# Patient Record
Sex: Male | Born: 1990 | Race: Black or African American | Hispanic: No | Marital: Single | Smoking: Never smoker
Health system: Southern US, Community
[De-identification: ages and names within clinical notes are randomized; demographics above are authoritative.]

## PROBLEM LIST (undated history)

## (undated) DIAGNOSIS — G44009 Cluster headache syndrome, unspecified, not intractable: Secondary | ICD-10-CM

---
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
## 2010-04-11 ENCOUNTER — Emergency Department (HOSPITAL_COMMUNITY): Admission: EM | Admit: 2010-04-11 | Discharge: 2010-04-11 | Payer: Self-pay | Admitting: Emergency Medicine

## 2010-04-27 ENCOUNTER — Ambulatory Visit: Payer: Self-pay | Admitting: Family Medicine

## 2010-06-08 ENCOUNTER — Encounter: Payer: Self-pay | Admitting: *Deleted

## 2010-10-30 NOTE — Assessment & Plan Note (Signed)
Summary: f/u migrain,df   Vital Signs:  Patient profile:   20 year old male Height:      67.75 inches Weight:      146.1 pounds BMI:     22.46 Temp:     97.7 degrees F Pulse rate:   96 / minute BP sitting:   121 / 76  (left arm) Cuff size:   regular  Vitals Entered By: Garen Grams LPN (April 27, 2010 2:18 PM) CC: frequent migraines Is Patient Diabetic? No Pain Assessment Patient in pain? no        Primary Care Provider:  Romero Belling MD  CC:  frequent migraines.  History of Present Illness: Patient here for c/o HA daily upon awakening x 5 years.  Frontal, last 5-10 mins then usually resolve, associated with dizziness, occ nausea, occ photosensitivity, no sens to sound.  Worse with lying down.  He denies any visual disturbances, and states he had his eyes checked 2 months ago and did not near glasses/contacts.  He drinks caffeinated beverages 1-2 x/week, does not take any meds for HA (does not like to take meds at all), drinks ETOH on holidays only. He states he never feels rested despite 10 hrs of sleep a night. Denies snoring or nighttime awakening. He was seen in the ED recently for HA and referred to Neurologist in [place].    PMHx: Allergic rhinitis - but does not know what he's allergic to, states "nothing." On no meds. PSHx: Neg Meds: none    Habits & Providers  Alcohol-Tobacco-Diet     Tobacco Status: never  Allergies: No Known Drug Allergies  Physical Exam  General:  Well-developed,well-nourished,in no acute distress; alert,appropriate and cooperative throughout examination Head:  Normocephalic and atraumatic without obvious abnormalities. No apparent alopecia or balding. Tight braids (gets hair done q2 months - denies any association with HA) Eyes:  No corneal or conjunctival inflammation noted. EOMI. Perrla. Funduscopic exam benign, without hemorrhages, exudates or papilledema. Vision grossly normal.  Squinting frequently during exam. Ears:  External  ear exam shows no significant lesions or deformities.  Otoscopic examination reveals clear canals, tympanic membranes are intact bilaterally without bulging, retraction, inflammation or discharge. Hearing is grossly normal bilaterally. Nose:  External nasal examination shows no deformity or inflammation. Nasal mucosa are pink and moist without lesions or exudates. L turbinates mildly edematous. No pain on palpation of sinuses. Mouth:  Oral mucosa and oropharynx without lesions or exudates.  Unable to open mouth fully. No TMJ tenderness or clicking. Neck:  No deformities, masses, or tenderness noted. No occipital or paraspinal cervical tenderness. Lungs:  Normal respiratory effort, chest expands symmetrically. Lungs are clear to auscultation, no crackles or wheezes. Heart:  Normal rate and regular rhythm. S1 and S2 normal without gallop, murmur, click, rub or other extra sounds. Msk:  No deformity or scoliosis noted of thoracic or lumbar spine.   Extremities:  No clubbing, cyanosis, edema, or deformity noted with normal full range of motion of all joints.   Neurologic:  No cranial nerve deficits noted. Station and gait are normal. Plantar reflexes are down-going bilaterally. DTRs are symmetrical throughout. Sensory, motor and coordinative functions appear intact.   Impression & Recommendations:  Problem # 1:  HEADACHE (ICD-784.0) No clear etiology.  Will order sleep study as HA are upon awakening and patient never feels rested after sleep.  Discussed foods that are common HA triggers. Urged patient to keep appointment with Neuro in 2 weeks.   Orders: Split Night (Split Night)  [facility]- Est Level  3 ([id number])

## 2010-10-30 NOTE — Miscellaneous (Signed)
Summary: unable to go to sleep disorders center  Clinical Lists Changes she did not have a ride there. she will call them when able to reschedule...Marland KitchenMarland KitchenGolden Circle RN  June 08, 2010 12:08 PM
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
## 2010-12-16 LAB — URINALYSIS, ROUTINE W REFLEX MICROSCOPIC
Hgb urine dipstick: NEGATIVE
Nitrite: NEGATIVE
Protein, ur: NEGATIVE mg/dL
Specific Gravity, Urine: 1.024 (ref 1.005–1.030)
Urobilinogen, UA: 0.2 mg/dL (ref 0.0–1.0)

## 2010-12-16 LAB — GLUCOSE, CAPILLARY: Glucose-Capillary: 103 mg/dL — ABNORMAL HIGH (ref 70–99)

## 2012-02-18 ENCOUNTER — Encounter: Payer: Self-pay | Admitting: Family Medicine

## 2012-06-11 ENCOUNTER — Encounter: Payer: Self-pay | Admitting: Family Medicine

## 2012-06-18 ENCOUNTER — Encounter: Payer: Self-pay | Admitting: Family Medicine

## 2012-06-26 ENCOUNTER — Ambulatory Visit (HOSPITAL_COMMUNITY)
Admission: RE | Admit: 2012-06-26 | Discharge: 2012-06-26 | Disposition: A | Discharge status: Home / Self Care | Payer: Medicaid Other | Source: Ambulatory Visit | Attending: Family Medicine | Admitting: Family Medicine

## 2012-06-26 ENCOUNTER — Ambulatory Visit (INDEPENDENT_AMBULATORY_CARE_PROVIDER_SITE_OTHER): Payer: Medicaid Other | Admitting: Family Medicine

## 2012-06-26 ENCOUNTER — Encounter: Payer: Self-pay | Admitting: Family Medicine

## 2012-06-26 VITALS — BP 105/74 | HR 67 | Temp 98.3°F | Ht 67.75 in | Wt 148.0 lb

## 2012-06-26 DIAGNOSIS — R42 Dizziness and giddiness: Secondary | ICD-10-CM | POA: Insufficient documentation

## 2012-06-26 DIAGNOSIS — Z0000 Encounter for general adult medical examination without abnormal findings: Secondary | ICD-10-CM

## 2012-06-26 DIAGNOSIS — M549 Dorsalgia, unspecified: Secondary | ICD-10-CM
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
## 2012-06-26 NOTE — Patient Instructions (Signed)
It was good to see you.  I want to get an ECHO, an ultrasound of your heart to make sure there is nothing dangerous causing your dizziness when you are working out.  Please be sure to keep that appointment.  I do not want you to do any high intensity exercise until that study is done.    Please plan on your next check up in 1 year, or sooner if needed.  Happy Birthday next week.
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
## 2012-06-26 NOTE — Assessment & Plan Note (Signed)
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  Feel that at his age it is unlikely to be HOCM, but cannot rule out.  ECG was NSR, will have ECHO done to rule out HOCM. Advised not to do strenuous exercise until ECHO done and normal.

## 2012-07-06 ENCOUNTER — Encounter: Payer: Self-pay | Admitting: Family Medicine

## 2012-07-08 ENCOUNTER — Ambulatory Visit (HOSPITAL_COMMUNITY): Payer: Self-pay | Attending: Family Medicine

## 2015-08-10 ENCOUNTER — Encounter (HOSPITAL_COMMUNITY): Payer: Self-pay | Admitting: Emergency Medicine

## 2015-08-10 ENCOUNTER — Emergency Department (HOSPITAL_COMMUNITY)
Admission: EM | Admit: 2015-08-10 | Discharge: 2015-08-10 | Disposition: A | Discharge status: Home / Self Care | Payer: Medicaid Other | Attending: Emergency Medicine | Admitting: Emergency Medicine

## 2015-08-10 DIAGNOSIS — K920 Hematemesis: Secondary | ICD-10-CM

## 2015-08-10 DIAGNOSIS — K2970 Gastritis, unspecified, without bleeding: Secondary | ICD-10-CM | POA: Insufficient documentation

## 2015-08-10 LAB — CBC WITH DIFFERENTIAL/PLATELET
Basophils Absolute: 0 10*3/uL (ref 0.0–0.1)
Basophils Relative: 0 %
Eosinophils Absolute: 0.1 10*3/uL (ref 0.0–0.7)
Eosinophils Relative: 1 %
HCT: 47.7 % (ref 39.0–52.0)
Hemoglobin: 16.4 g/dL (ref 13.0–17.0)
Lymphocytes Relative: 9 %
Lymphs Abs: 0.6 10*3/uL — ABNORMAL LOW (ref 0.7–4.0)
MCH: 30.6 pg (ref 26.0–34.0)
MCHC: 34.4 g/dL (ref 30.0–36.0)
MCV: 89 fL (ref 78.0–100.0)
Monocytes Absolute: 0.3 10*3/uL (ref 0.1–1.0)
Monocytes Relative: 5 %
Neutro Abs: 5.3 10*3/uL (ref 1.7–7.7)
Neutrophils Relative %: 85 %
Platelets: 245 10*3/uL (ref 150–400)
RBC: 5.36 MIL/uL (ref 4.22–5.81)
RDW: 13.4 % (ref 11.5–15.5)
WBC: 6.4 10*3/uL (ref 4.0–10.5)

## 2015-08-10 LAB — COMPREHENSIVE METABOLIC PANEL
ALT: 11 U/L — ABNORMAL LOW (ref 17–63)
AST: 18 U/L (ref 15–41)
Albumin: 3.4 g/dL — ABNORMAL LOW (ref 3.5–5.0)
Alkaline Phosphatase: 51 U/L (ref 38–126)
Anion gap: 10 (ref 5–15)
BUN: 10 mg/dL (ref 6–20)
CO2: 27 mmol/L (ref 22–32)
Calcium: 9.1 mg/dL (ref 8.9–10.3)
Chloride: 100 mmol/L — ABNORMAL LOW (ref 101–111)
Creatinine, Ser: 1.29 mg/dL — ABNORMAL HIGH (ref 0.61–1.24)
GFR calc Af Amer: >60 mL/min (ref >60)
GFR calc non Af Amer: >60 mL/min (ref >60)
Glucose, Bld: 98 mg/dL (ref 65–99)
Potassium: 4.7 mmol/L (ref 3.5–5.1)
Sodium: 137 mmol/L (ref 135–145)
Total Bilirubin: 1.1 mg/dL (ref 0.3–1.2)
Total Protein: 6.3 g/dL — ABNORMAL LOW (ref 6.5–8.1)

## 2015-08-10 LAB — LIPASE, BLOOD: Lipase: 25 U/L (ref 11–51)

## 2015-08-10 MED ORDER — GI COCKTAIL ~~LOC~~
30.0000 mL | Freq: Once | ORAL | Status: AC
  Administered 2015-08-10: 30 mL via ORAL
  Filled 2015-08-10: qty 30

## 2015-08-10 MED ORDER — ONDANSETRON HCL 4 MG PO TABS: 4.0000 mg | ORAL_TABLET | Freq: Four times a day (QID) | ORAL | Status: DC

## 2015-08-10 MED ORDER — ONDANSETRON 4 MG PO TBDP
4.0000 mg | ORAL_TABLET | Freq: Once | ORAL | Status: AC
  Administered 2015-08-10: 4 mg via ORAL
  Filled 2015-08-10: qty 1

## 2015-08-10 NOTE — Discharge Instructions (Signed)
Gastritis, Adult Gastritis is soreness and puffiness (inflammation) of the lining of the stomach. If you do not get help, gastritis can cause bleeding and sores (ulcers) in the stomach. HOME CARE   Only take medicine as told by your doctor.  If you were given antibiotic medicines, take them as told. Finish the medicines even if you start to feel better.  Drink enough fluids to keep your pee (urine) clear or pale yellow.  Avoid foods and drinks that make your problems worse. Foods you may want to avoid include:  Caffeine or alcohol.  Chocolate.  Mint.  Garlic and onions.  Spicy foods.  Citrus fruits, including oranges, lemons, or limes.  Food containing tomatoes, including sauce, chili, salsa, and pizza.  Fried and fatty foods.  Eat small meals throughout the day instead of large meals. GET HELP RIGHT AWAY IF:   You have black or dark red poop (stools).  You throw up (vomit) blood. It may look like coffee grounds.  You cannot keep fluids down.  Your belly (abdominal) pain gets worse.  You have a fever.  You do not feel better after 1 week.  You have any other questions or concerns. MAKE SURE YOU:   Understand these instructions.  Will watch your condition.  Will get help right away if you are not doing well or get worse.   This information is not intended to replace advice given to you by your health care provider. Make sure you discuss any questions you have with your health care provider.   Document Released: 03/04/2008 Document Revised: 12/09/2011 Document Reviewed: 10/30/2011 Elsevier Interactive Patient Education 2016 [employer].  Please read attached information. If you experience any new or worsening signs or symptoms please return to the emergency room for evaluation. Please follow-up with your primary care provider or specialist as discussed. Please use medication prescribed only as directed and discontinue taking if you have any concerning signs or  symptoms.    Emergency Department Resource Guide 1) Find a Doctor and Pay Out of Pocket Although you won't have to find out who is covered by your insurance plan, it is a good idea to ask around and get recommendations. You will then need to call the office and see if the doctor you have chosen will accept you as a new patient and what types of options they offer for patients who are self-pay. Some doctors offer discounts or will set up payment plans for their patients who do not have insurance, but you will need to ask so you aren't surprised when you get to your appointment.  2) Contact Your Local Health Department Not all health departments have doctors that can see patients for sick visits, but many do, so it is worth a call to see if yours does. If you don't know where your local health department is, you can check in your phone book. The CDC also has a tool to help you locate your state's health department, and many state websites also have listings of all of their local health departments.  3) Find a Walk-in Clinic If your illness is not likely to be very severe or complicated, you may want to try a walk in clinic. These are popping up all over the country in pharmacies, drugstores, and shopping centers. They're usually staffed by nurse practitioners or physician assistants that have been trained to treat common illnesses and complaints. They're usually fairly quick and inexpensive. However, if you have serious medical issues or chronic medical problems,  these are probably not your best option.  No Primary Care Doctor: - Call Health Connect at  [phone] - they can help you locate a primary care doctor that  accepts your insurance, provides certain services, etc. - Physician Referral Service- [phone]  Chronic Pain Problems: Organization         Address  Phone   Notes  [facility]  [phone] Patients need to be referred by their primary care doctor.    Medication Assistance: Organization         Address  Phone   Notes  [facility] Medication [facility] [address] [place], [place] [address] [phone] --Must be a resident of [facility] -- Must have NO insurance coverage whatsoever (no Medicaid/ Medicare, etc.) -- The pt. MUST have a primary care doctor that directs their care regularly and follows them in the community   [facility]  [phone]   [employer]  [phone]    Agencies that provide inexpensive medical care: Organization         Address  Phone   Notes  [facility]  [phone]   [facility]    [phone]   [facility] [address] [place], [place] [address] [phone]   [facility] [address] [place] [phone]   Planned Parenthood    [phone]   [facility]    [phone]   [facility]  [address], [place] Phone:  [phone], Fax:  [phone] Hours of Operation:  9 am - 6 pm, M-F.  Also accepts Medicaid/Medicare and self-pay.  [facility]  [address], [place] Phone: [phone], Fax: [phone]. Hours of Operation:  8:30 am - 5:30 pm, M-F.  Also accepts Medicaid and self-pay.  [facility] [place] [address], [place] Phone: [phone]   [facility] [address] Natasha Bence [place], [place] [phone], Ext. [phone] Mondays & Thursdays: 7-9 AM.  First 15 patients are seen on a first come, first serve basis.    Medicaid-accepting [facility] Providers:  Organization         Address  Phone   Notes  [facility] [address], [place] [phone] Also accepts self-pay patients.  [facility] [address] Laurell Josephs [place], [place]  [phone]   [facility] [address], [place] [phone]   [facility] [address] [place] [phone]   Renaye Rakers [address], [place]   [phone] Only accepts [place] patients after they have their name applied to their card.   Self-Pay (no insurance) in [facility]:  Organization         Address  Phone   Notes  Sickle Cell Patients, [facility] [address] [place], [place] [phone]   [facility] [address] [place], [place] [phone]   [facility] [place]  [address], [place] [phone]   [facility]/Dr. Osei-Bonsu  [address], [place] or [address], [place] [phone] Phone number for both [place] and [place] locations is the same.  [facility]gent [facility] and  [facility] [address], [place] [phone]   [facility] [address] [place] or [address] [phone]   [facility] [address], [place] [phone], phone; [phone], fax Sees patients 1st and 3rd Saturday of every month.  Must not qualify for public or private insurance (i.e. Medicaid, Medicare, [place] Health Choice, Veterans' Benefits)  Household income should be no more than 200% of the poverty level The clinic cannot treat you if you are pregnant or think you are pregnant  Sexually transmitted diseases are not treated at the clinic.    Dental Care: Organization         Address  Phone  Notes  [facility] Department of [facility] [address] [place], [place] [phone] Accepts children up to age 32 who are enrolled in [place] or [place] Health Choice; pregnant women with a Medicaid card; and children who have applied for Medicaid or [place] Health Choice, but were declined, whose parents can pay a reduced fee at time of service.    [facility] Department of [facility]  [address], [place] [phone] Accepts children up to age 43 who are enrolled in [place] or [place] Health Choice; pregnant women with a Medicaid card; and children who have applied for Medicaid or  Health Choice, but were declined, whose parents can pay a reduced fee at time of service.  [facility]  [address] [place], [place] [phone] Patients are seen by appointment only. Walk-ins are not accepted. [facility] will see patients 48 years of age and older. Monday - Tuesday (8am-5pm) Most Wednesdays (8:30-5pm) $30 per visit, cash only  [facility] Dental Access [facility]  [address], [facility] [phone] Patients are seen by appointment only. Walk-ins are not accepted. [facility] will see patients 40 years of age and older. One Wednesday Evening (Monthly: Volunteer Based).  $30 per visit, cash only  [employer]  [phone] for adults; Children under age 84, call Graduate Pediatric Dentistry at [phone]. Children aged 74-14, please call [phone] to request a pediatric application.  Dental services are provided in all areas of dental care including fillings, crowns and bridges, complete and partial dentures, implants, gum treatment, root canals, and extractions. Preventive care is also provided. Treatment is provided to both adults and children. Patients are selected via a lottery and there is often a waiting list.   [facility] [address] [place]  [phone] [email or web address]   [facility] [address] [place], [place] [phone], Ext. [phone] Second and Fourth Thursday of each month, opens at 6:30 AM; Clinic ends at 9 AM.  Patients are seen on a first-come first-served basis, and a limited number are seen during each clinic.   [facility]  [address] Ether Griffins [place], [place] [phone]   Eligibility Requirements You must have lived in [place], [place], or [place] counties for at least the last three months.   You cannot be eligible for state or federal sponsored [place], including [employer], [place], or [employer].   You generally cannot be eligible for healthcare insurance through your employer.    How to apply: Eligibility screenings are held every Tuesday and Wednesday afternoon from 1:00 pm until 4:00 pm. You do not need an appointment for the interview!  [place]  [facility] [address], [place], [place] [phone]   [facility]  [phone]   [facility]  [phone]   [facility]  [phone]    Behavioral Health Resources in the Community: Intensive Outpatient Programs Organization         Address  Phone  Notes  [facility] [address], [place], [place] [phone]   [facility] [address], [place], [place] [phone]   [facility] [address], [place], [place]  [phone]   [facility] [address],  [place], [place] [phone] or [phone]   Substance Abuse Resources Organization         Address  Phone  Notes  [facility]  [phone]   [facility]  [phone]   [facility]  [phone]   [facility]  [phone]   Residential & Outpatient Substance Abuse Program  [phone]   Psychological Services Organization         Address  Phone  Notes  [facility]  [phone]- [phone]   [facility]  [phone]- [phone]   [facility] [address], [place] [phone] or [phone]    Mobile Crisis Teams Organization         Address  Phone  Notes  [facility], Mobile Crisis Care Unit  [phone]   [facility]  [address] [place], [place] [phone]   Doristine LocksSharon DeEsch [address] [place] [phone]    Self-Help/Support Groups Organization         Address  Phone             Notes  [facility] [place] - variety of support groups  336- [phone] Call for more information  Narcotics Anonymous (NA), [facility] [address], [place]  2 meetings at this location   Statisticianesidential Treatment Programs Organization         Address  Phone  Notes  [facility] [address],    [place]  [phone]   [facility]  [address], [place] [phone], [place], [place] [phone]   [facility] [address], [place] [phone] Admissions: 8am-3pm M-F  [facility] [address]    [place], [place] [phone]   [facility] [address], [place], [place] [phone]   [facility] [address]  [place], [place] [phone]   [facility] - Intensive Outpatient [address], [place], [place] [phone]   [facility] ([facility] [address]  [place], [place] [phone] or [phone]   [facility] ([facility]) [address] [place], [place] [phone] Accepts Medicaid  [facility] [address]  [place] [phone] Substance Abuse/Addiction Treatment   [facility] Resources Organization         Address  Phone  Notes  [facility]  [phone]   Angie FavaJulie Brannon, PhD [address], [address] [place], [place]   [phone]) [phone] or [phone](336) [phone]   [facility]   [address] [place], [place] [phone]   [facility] [address], [place], [place] [phone](336) [phone] Insurance/Medicaid/sponsorship through [employer]  [facility] [address]  [place], [place] [phone]  Therapy/tele-psych/case  °[facility] [address]  ° [place], [place] [phone]    °Dr. Arfeen  [phone]   °[facility] [place]  [employer] [place] [facility] 1) [address],  °2) [address], [place] °3)  [address], [place] [phone] °([phone] ° °([phone]   °[place] Child Abuse Hotline (336) [phone] or ([phone] (After Hours)    ° ° ° °

## 2015-08-10 NOTE — ED Notes (Signed)
Pt states he started having abd cramping and vomiting this morning around 3am. Pt states he vomited blood when he arrived in the waiting room.

## 2015-08-10 NOTE — ED Provider Notes (Signed)
CSN: [id number]     Arrival date & time 08/10/15  1191 History   First MD Initiated Contact with Patient 08/10/15 0857     Chief Complaint  Patient presents with  . Abdominal Pain  . Vomiting    HPI   24 year old male presents today with abdominal pain, nausea or vomiting. Patient reports symptoms started last night, and progressed through this morning. Patient reports 3-4 episodes of nausea and vomiting, reporting "dark" pieces in his vomit. Patient reports that does not necessarily look like blood, but does not rebreathing anything red or similar color. At the time of evaluation patient reports that he still feeling nauseous, has not had any recent vomiting. He denies fever, chills, lower extremity swelling or edema. Patient reports that up until last night he was eating and drinking appropriately, denies anyone in the house with similar symptoms. Patient denies any drug or alcohol use, exposure to abnormal food or drinks  History reviewed. No pertinent past medical history. History reviewed. No pertinent past surgical history. Family History  Problem Relation Age of Onset  . Heart disease Cousin 53    Died suddenly during exercise of heart problem- patient does not know what kind.   Social History  Substance Use Topics  . Smoking status: Never Smoker   . Smokeless tobacco: Never Used  . Alcohol Use: No    Review of Systems  All other systems reviewed and are negative.   Allergies  Review of patient's allergies indicates no known allergies.  Home Medications   Prior to Admission medications   Medication Sig Start Date End Date Taking? Authorizing Provider  ondansetron (ZOFRAN) 4 MG tablet Take 1 tablet (4 mg total) by mouth every 6 (six) hours. 08/10/15   Eyvonne Mechanic, PA-C   BP 111/68 mmHg  Pulse 77  Temp(Src) 97.4 F (36.3 C) (Oral)  Resp 12  Ht  (1.702 m)  Wt 138 lb (62.596 kg)  BMI 21.61 kg/m2  SpO2 99%   Physical Exam  Constitutional: He is oriented to  person, place, and time. He appears well-developed and well-nourished.  HENT:  Head: Normocephalic and atraumatic.  Eyes: Conjunctivae are normal. Pupils are equal, round, and reactive to light. Right eye exhibits no discharge. Left eye exhibits no discharge. No scleral icterus.  Neck: Normal range of motion. No JVD present. No tracheal deviation present.  Cardiovascular: Normal rate, regular rhythm, normal heart sounds and intact distal pulses.  Exam reveals no gallop and no friction rub.   No murmur heard. Pulmonary/Chest: Effort normal and breath sounds normal. No stridor. No respiratory distress. He has no wheezes. He has no rales. He exhibits no tenderness.  Minor epigastric discomfort to palpation  Abdominal: Soft. He exhibits no distension and no mass. There is tenderness. There is no rebound and no guarding.  Musculoskeletal: Normal range of motion. He exhibits no edema or tenderness.  Neurological: He is alert and oriented to person, place, and time. Coordination normal.  Skin: Skin is warm and dry. No rash noted. No erythema. No pallor.  Psychiatric: He has a normal mood and affect. His behavior is normal. Judgment and thought content normal.  Nursing note and vitals reviewed.   ED Course  Procedures (including critical care time) Labs Review Labs Reviewed  CBC WITH DIFFERENTIAL/PLATELET - Abnormal; Notable for the following:    Lymphs Abs 0.6 (*)    All other components within normal limits  COMPREHENSIVE METABOLIC PANEL - Abnormal; Notable for the following:    Chloride  100 (*)    Creatinine, Ser 1.29 (*)    Total Protein 6.3 (*)    Albumin 3.4 (*)    ALT 11 (*)    All other components within normal limits  LIPASE, BLOOD    Imaging Review No results found. I have personally reviewed and evaluated these images and lab results as part of my medical decision-making.   EKG Interpretation None      MDM   Final diagnoses:  Gastritis  Hematemesis with nausea     Labs: CBC, CMP, lipase- significant for creatinine 1.29  Imaging:  Consults:  Therapeutics: Zofran, GI cocktail  Discharge Meds: Zofran  Assessment/Plan: Patient presentation most consistent with gastritis. He is afebrile, vital signs reassuring, tolerating by mouth without difficulty. No episodes of nausea or vomiting while in the ED. patient's mother present at the time of evaluation, patient will be discharged home with her with instructions to maintain adequate by mouth intake, monitor for new or worsening signs or symptoms, return immediately if any present. Patient is instructed to follow-up with his primary care provider in 2-3 days for reevaluation of elevated creatinine, although I believe this probably 2 to acute decrease in hydration status. Patient has no other concerning signs or symptoms today. Patient given strict return presents.         Eyvonne MechanicJeffrey Starlin Steib, PA-C 08/10/15 1731  Marily MemosJason Mesner, MD 08/11/15 [phone]

## 2015-08-12 ENCOUNTER — Emergency Department (HOSPITAL_COMMUNITY)
Admission: EM | Admit: 2015-08-12 | Discharge: 2015-08-12 | Disposition: A | Discharge status: Home / Self Care | Payer: Medicaid Other | Attending: Emergency Medicine | Admitting: Emergency Medicine

## 2015-08-12 ENCOUNTER — Emergency Department (HOSPITAL_COMMUNITY): Payer: Medicaid Other

## 2015-08-12 ENCOUNTER — Encounter (HOSPITAL_COMMUNITY): Payer: Self-pay | Admitting: Emergency Medicine

## 2015-08-12 DIAGNOSIS — R05 Cough: Secondary | ICD-10-CM | POA: Insufficient documentation

## 2015-08-12 DIAGNOSIS — K529 Noninfective gastroenteritis and colitis, unspecified: Secondary | ICD-10-CM | POA: Insufficient documentation

## 2015-08-12 DIAGNOSIS — R1033 Periumbilical pain: Secondary | ICD-10-CM | POA: Insufficient documentation

## 2015-08-12 LAB — CBC WITH DIFFERENTIAL/PLATELET
Basophils Absolute: 0 10*3/uL (ref 0.0–0.1)
Basophils Relative: 0 %
Eosinophils Absolute: 0.1 10*3/uL (ref 0.0–0.7)
Eosinophils Relative: 4 %
HCT: 43.5 % (ref 39.0–52.0)
HEMOGLOBIN: 15.1 g/dL (ref 13.0–17.0)
LYMPHS ABS: 1.1 10*3/uL (ref 0.7–4.0)
LYMPHS PCT: 28 %
MCH: 30.8 pg (ref 26.0–34.0)
MCHC: 34.7 g/dL (ref 30.0–36.0)
MCV: 88.8 fL (ref 78.0–100.0)
Monocytes Absolute: 0.5 10*3/uL (ref 0.1–1.0)
Monocytes Relative: 12 %
NEUTROS ABS: 2.3 10*3/uL (ref 1.7–7.7)
NEUTROS PCT: 56 %
Platelets: 259 10*3/uL (ref 150–400)
RBC: 4.9 MIL/uL (ref 4.22–5.81)
RDW: 13.4 % (ref 11.5–15.5)
WBC: 4 10*3/uL (ref 4.0–10.5)

## 2015-08-12 LAB — COMPREHENSIVE METABOLIC PANEL
ALK PHOS: 50 U/L (ref 38–126)
ALT: 12 U/L — AB (ref 17–63)
ANION GAP: 8 (ref 5–15)
AST: 19 U/L (ref 15–41)
Albumin: 3.3 g/dL — ABNORMAL LOW (ref 3.5–5.0)
BUN: 7 mg/dL (ref 6–20)
CO2: 25 mmol/L (ref 22–32)
Calcium: 8.8 mg/dL — ABNORMAL LOW (ref 8.9–10.3)
Chloride: 105 mmol/L (ref 101–111)
Creatinine, Ser: 1.06 mg/dL (ref 0.61–1.24)
GFR calc Af Amer: >60 mL/min (ref >60)
GLUCOSE: 101 mg/dL — AB (ref 65–99)
POTASSIUM: 4.3 mmol/L (ref 3.5–5.1)
SODIUM: 138 mmol/L (ref 135–145)
TOTAL PROTEIN: 5.8 g/dL — AB (ref 6.5–8.1)
Total Bilirubin: 0.5 mg/dL (ref 0.3–1.2)

## 2015-08-12 LAB — LIPASE, BLOOD: Lipase: 28 U/L (ref 11–51)

## 2015-08-12 MED ORDER — IOHEXOL 300 MG/ML  SOLN
100.0000 mL | Freq: Once | INTRAMUSCULAR | Status: AC | PRN
  Administered 2015-08-12: 100 mL via INTRAVENOUS

## 2015-08-12 MED ORDER — PANTOPRAZOLE SODIUM 20 MG PO TBEC: 40.0000 mg | DELAYED_RELEASE_TABLET | Freq: Every day | ORAL | Status: DC

## 2015-08-12 MED ORDER — SODIUM CHLORIDE 0.9 % IV BOLUS (SEPSIS)
1000.0000 mL | Freq: Once | INTRAVENOUS | Status: AC
  Administered 2015-08-12: 1000 mL via INTRAVENOUS

## 2015-08-12 MED ORDER — PANTOPRAZOLE SODIUM 40 MG IV SOLR
40.0000 mg | INTRAVENOUS | Status: AC
  Administered 2015-08-12: 40 mg via INTRAVENOUS
  Filled 2015-08-12: qty 40

## 2015-08-12 MED ORDER — ONDANSETRON HCL 4 MG/2ML IJ SOLN
4.0000 mg | Freq: Once | INTRAMUSCULAR | Status: AC
  Administered 2015-08-12: 4 mg via INTRAVENOUS
  Filled 2015-08-12: qty 2

## 2015-08-12 NOTE — ED Provider Notes (Signed)
CSN: [id number]     Arrival date & time 08/12/15  4098 History   First MD Initiated Contact with Patient 08/12/15 [phone]     Chief Complaint  Patient presents with  . Emesis     (Consider location/radiation/quality/duration/timing/severity/associated sxs/prior Treatment) HPI Comments: Patient is a 24 year old male who presents to the ED with complaint of vomiting, onset 3 days. Patient states he woke up Thursday morning feeling nauseous and since has had multiple episodes of vomiting daily. He reports 2-3 episodes of emesis with red blood. He notes when he vomited this morning his emesis was all red. Endorses constant "gasy" abdominal pain with intermittent sharp pain that occurs with deep exhalation. Endorses chills, diarrhea (2 episodes yesterday) and productive cough (yellow sputum). He notes he has been taking Robitussin without relief. Denies fever, headache, rhinorrhea, nasal congestion, sore throat, SOB, wheezing, CP, urinary symptoms, blood in urine or stool. Patient was seen in the ED on 11/10 for same complaint, Cr 1.29 labs otherwise unremarkable. Patient symptoms were thought to be due to gastritis, improved with Zofran in the ED, pt was discharged home with Zofran. Patient reports he was unable to get Zofran filled due to cost. He notes he has only been able to keep down a little bit of water and food however he continues to have multiple episodes of vomiting throughout the day. Denies any prior abdominal surgeries.   History reviewed. No pertinent past medical history. History reviewed. No pertinent past surgical history. Family History  Problem Relation Age of Onset  . Heart disease Cousin 33    Died suddenly during exercise of heart problem- patient does not know what kind.   Social History  Substance Use Topics  . Smoking status: Never Smoker   . Smokeless tobacco: Never Used  . Alcohol Use: No    Review of Systems  Constitutional: Positive for chills.  Respiratory:  Positive for cough.   Gastrointestinal: Positive for nausea, vomiting, abdominal pain and diarrhea.       Belching  All other systems reviewed and are negative.     Allergies  Review of patient's allergies indicates no known allergies.  Home Medications   Prior to Admission medications   Medication Sig Start Date End Date Taking? Authorizing Provider  ibuprofen (ADVIL,MOTRIN) 200 MG tablet Take 600 mg by mouth every 6 (six) hours as needed for moderate pain.   Yes Historical Provider, MD  ondansetron (ZOFRAN) 4 MG tablet Take 1 tablet (4 mg total) by mouth every 6 (six) hours. Patient not taking: Reported on 08/12/2015 08/10/15   Eyvonne Mechanic, PA-C  pantoprazole (PROTONIX) 20 MG tablet Take 2 tablets (40 mg total) by mouth daily. 08/12/15   Satira Sark Angelmarie Ponzo, PA-C   BP 103/61 mmHg  Pulse 70  Temp(Src) 98.2 F (36.8 C) (Oral)  Resp 18  Ht  (1.702 m)  Wt 138 lb (62.596 kg)  BMI 21.61 kg/m2  SpO2 98% Physical Exam  Constitutional: He is oriented to person, place, and time. He appears well-developed and well-nourished. No distress.  HENT:  Head: Normocephalic and atraumatic.  Mouth/Throat: Uvula is midline, oropharynx is clear and moist and mucous membranes are normal. No oropharyngeal exudate, posterior oropharyngeal edema or posterior oropharyngeal erythema.  Eyes: Conjunctivae and EOM are normal. Pupils are equal, round, and reactive to light. Right eye exhibits no discharge. Left eye exhibits no discharge. No scleral icterus.  Neck: Normal range of motion. Neck supple.  Cardiovascular: Normal rate, regular rhythm, normal heart sounds and intact  distal pulses.   Pulmonary/Chest: Effort normal and breath sounds normal. No respiratory distress. He has no wheezes. He has no rales. He exhibits no tenderness.  Abdominal: Soft. Bowel sounds are normal. He exhibits no distension and no mass. There is tenderness in the periumbilical area. There is guarding (Pt guarding with  and witout palpation of abdomen.). There is no rigidity, no rebound, no CVA tenderness, no tenderness at McBurney's point and negative Murphy's sign.  Musculoskeletal: Normal range of motion. He exhibits no edema.  Lymphadenopathy:    He has no cervical adenopathy.  Neurological: He is alert and oriented to person, place, and time.  Skin: Skin is warm and dry. He is not diaphoretic.  Nursing note and vitals reviewed.   ED Course  Procedures (including critical care time) Labs Review Labs Reviewed  COMPREHENSIVE METABOLIC PANEL - Abnormal; Notable for the following:    Glucose, Bld 101 (*)    Calcium 8.8 (*)    Total Protein 5.8 (*)    Albumin 3.3 (*)    ALT 12 (*)    All other components within normal limits  CBC WITH DIFFERENTIAL/PLATELET  LIPASE, BLOOD    Imaging Review Ct Abdomen Pelvis W Contrast  08/12/2015  CLINICAL DATA:  Umbilical pain since Thursday with vomiting. EXAM: CT ABDOMEN AND PELVIS WITH CONTRAST TECHNIQUE: Multidetector CT imaging of the abdomen and pelvis was performed using the standard protocol following bolus administration of intravenous contrast. CONTRAST:  100mL OMNIPAQUE IOHEXOL 300 MG/ML  SOLN COMPARISON:  None. FINDINGS: Lung bases:  Clear.  Heart normal in size. Liver, spleen, gallbladder, pancreas, adrenal glands:  Normal. Kidneys, ureters, bladder:  Normal. Lymph nodes:  No adenopathy. Ascites:  None. Gastrointestinal: Stomach is moderately distended, primarily with fluid. Small bowel there is prominent fluid-filled, which shows no wall thickening or mesenteric inflammation. There is no evidence of obstruction. Colon is unremarkable. Normal appendix visualized. Musculoskeletal:  Normal. IMPRESSION: 1. Distended stomach and prominent fluid-filled small bowel. Findings are nonspecific but consistent with gastroenteritis in the proper clinical setting. No evidence of bowel obstruction. 2. No other abnormalities.  Normal appendix visualized. Electronically  Signed   By: Amie Portlandavid  Ormond M.D.   On: 08/12/2015 12:12   I have personally reviewed and evaluated these images and lab results as part of my medical decision-making.  Filed Vitals:   08/12/15 1205  BP: 103/61  Pulse: 70  Temp:   Resp: 18     MDM   Final diagnoses:  Gastroenteritis    Patient presents with nausea, vomiting and an episode of diarrhea. Reports having blood in emesis. Denies fever. Patient was seen in the ED on 11/10, diagnosed with gastritis and discharged home with Zofran. Patient was unable to fill Zofran due to cost. VSS. Exam revealed periumbilical pain, guarding present with and without palpation of abdomen. Patient given IV fluids, Zofran and Protonix. Labs unremarkable. CT abdomen revealed distended stomach consistent with gastroenteritis. Patient reports his nausea and abdominal pain have improved. Patient has not had an episode of emesis on the ED, tolerated by mouth. Plan to discharge patient home with PPI.  Evaluation does not show pathology requring ongoing emergent intervention or admission. Pt is hemodynamically stable and mentating appropriately. Discussed findings/results and plan with patient/guardian, who agrees with plan. All questions answered. Return precautions discussed and outpatient follow up given.      Satira Sarkicole Elizabeth AllenhurstNadeau, New JerseyPA-C 08/12/15 1241  Tilden FossaElizabeth Rees, MD 08/13/15 [phone]

## 2015-08-12 NOTE — ED Notes (Signed)
Patient transported to CT 

## 2015-08-12 NOTE — ED Notes (Signed)
Pt placed on monitor upon return to room from radiology. Pt remains monitored by blood pressure and pulse ox.

## 2015-08-12 NOTE — ED Notes (Signed)
Reports continuing to vomit since seen in ED; did not get zofran filled. States he can keep down water and food during the day. States he keeps waking up in the early morning hours with vomiting. Today it was "grainy (like grits) and red" which worried him, so he returned.

## 2015-08-12 NOTE — ED Notes (Signed)
Pts vital signs updated. Pt getting dressed and awaiting discharge paperwork at bedside.

## 2015-08-12 NOTE — Discharge Instructions (Signed)
Take your medications as prescribed. Please follow up with a primary care provider from the Resource Guide provided below in 1 week. Return to the emergency department if symptoms worsen or new onset of fever.

## 2015-08-17 ENCOUNTER — Emergency Department (HOSPITAL_COMMUNITY)
Admission: EM | Admit: 2015-08-17 | Discharge: 2015-08-17 | Disposition: A | Discharge status: Home / Self Care | Payer: Medicaid Other | Attending: Emergency Medicine | Admitting: Emergency Medicine

## 2015-08-17 ENCOUNTER — Encounter (HOSPITAL_COMMUNITY): Payer: Self-pay | Admitting: Cardiology

## 2015-08-17 DIAGNOSIS — R509 Fever, unspecified: Secondary | ICD-10-CM | POA: Insufficient documentation

## 2015-08-17 DIAGNOSIS — R110 Nausea: Secondary | ICD-10-CM | POA: Insufficient documentation

## 2015-08-17 DIAGNOSIS — J029 Acute pharyngitis, unspecified: Secondary | ICD-10-CM | POA: Insufficient documentation

## 2015-08-17 DIAGNOSIS — Z79899 Other long term (current) drug therapy: Secondary | ICD-10-CM | POA: Insufficient documentation

## 2015-08-17 LAB — COMPREHENSIVE METABOLIC PANEL
ALT: 7 U/L — ABNORMAL LOW (ref 17–63)
AST: 17 U/L (ref 15–41)
Albumin: 3.2 g/dL — ABNORMAL LOW (ref 3.5–5.0)
Alkaline Phosphatase: 49 U/L (ref 38–126)
Anion gap: 6 (ref 5–15)
BUN: 6 mg/dL (ref 6–20)
CO2: 27 mmol/L (ref 22–32)
Calcium: 8.8 mg/dL — ABNORMAL LOW (ref 8.9–10.3)
Chloride: 107 mmol/L (ref 101–111)
Creatinine, Ser: 1.15 mg/dL (ref 0.61–1.24)
GFR calc Af Amer: >60 mL/min (ref >60)
GFR calc non Af Amer: >60 mL/min (ref >60)
Glucose, Bld: 85 mg/dL (ref 65–99)
Potassium: 4.4 mmol/L (ref 3.5–5.1)
Sodium: 140 mmol/L (ref 135–145)
Total Bilirubin: 0.5 mg/dL (ref 0.3–1.2)
Total Protein: 5.9 g/dL — ABNORMAL LOW (ref 6.5–8.1)

## 2015-08-17 LAB — URINALYSIS, ROUTINE W REFLEX MICROSCOPIC
Bilirubin Urine: NEGATIVE
Glucose, UA: NEGATIVE mg/dL
Hgb urine dipstick: NEGATIVE
Ketones, ur: NEGATIVE mg/dL
Leukocytes, UA: NEGATIVE
Nitrite: NEGATIVE
Protein, ur: NEGATIVE mg/dL
Specific Gravity, Urine: 1.024 (ref 1.005–1.030)
pH: 7.5 (ref 5.0–8.0)

## 2015-08-17 LAB — CBC
HCT: 42.5 % (ref 39.0–52.0)
Hemoglobin: 14.5 g/dL (ref 13.0–17.0)
MCH: 30.2 pg (ref 26.0–34.0)
MCHC: 34.1 g/dL (ref 30.0–36.0)
MCV: 88.5 fL (ref 78.0–100.0)
Platelets: 261 10*3/uL (ref 150–400)
RBC: 4.8 MIL/uL (ref 4.22–5.81)
RDW: 13.1 % (ref 11.5–15.5)
WBC: 9.2 10*3/uL (ref 4.0–10.5)

## 2015-08-17 LAB — RAPID STREP SCREEN (MED CTR MEBANE ONLY): Streptococcus, Group A Screen (Direct): NEGATIVE

## 2015-08-17 NOTE — ED Notes (Signed)
States he was here last week with abd pain and vomiting. Reports he was told to follow up, is feeling little better but still having episodes of vomiting. Also reports a sore throat.

## 2015-08-17 NOTE — Discharge Instructions (Signed)
Sore Throat A sore throat is pain, burning, irritation, or scratchiness of the throat. There is often pain or tenderness when swallowing or talking. A sore throat may be accompanied by other symptoms, such as coughing, sneezing, fever, and swollen neck glands. A sore throat is often the first sign of another sickness, such as a cold, flu, strep throat, or mononucleosis (commonly known as mono). Most sore throats go away without medical treatment. CAUSES  The most common causes of a sore throat include:  A viral infection, such as a cold, flu, or mono.  A bacterial infection, such as strep throat, tonsillitis, or whooping cough.  Seasonal allergies.  Dryness in the air.  Irritants, such as smoke or pollution.  Gastroesophageal reflux disease (GERD). HOME CARE INSTRUCTIONS   Only take over-the-counter medicines as directed by your caregiver.  Drink enough fluids to keep your urine clear or pale yellow.  Rest as needed.  Try using throat sprays, lozenges, or sucking on hard candy to ease any pain (if older than 4 years or as directed).  Sip warm liquids, such as broth, herbal tea, or warm water with honey to relieve pain temporarily. You may also eat or drink cold or frozen liquids such as frozen ice pops.  Gargle with salt water (mix 1 tsp salt with 8 oz of water).  Do not smoke and avoid secondhand smoke.  Put a cool-mist humidifier in your bedroom at night to moisten the air. You can also turn on a hot shower and sit in the bathroom with the door closed for 5-10 minutes. SEEK IMMEDIATE MEDICAL CARE IF:  You have difficulty breathing.  You are unable to swallow fluids, soft foods, or your saliva.  You have increased swelling in the throat.  Your sore throat does not get better in 7 days.  You have nausea and vomiting.  You have a fever or persistent symptoms for more than 2-3 days.  You have a fever and your symptoms suddenly get worse. MAKE SURE YOU:   Understand  these instructions.  Will watch your condition.  Will get help right away if you are not doing well or get worse.   This information is not intended to replace advice given to you by your health care provider. Make sure you discuss any questions you have with your health care provider.   Document Released: 10/24/2004 Document Revised: 10/07/2014 Document Reviewed: 05/24/2012 Elsevier Interactive Patient Education 2016 [employer]   Emergency Department Resource Guide 1) Find a Doctor and Pay Out of Pocket Although you won't have to find out who is covered by your insurance plan, it is a good idea to ask around and get recommendations. You will then need to call the office and see if the doctor you have chosen will accept you as a new patient and what types of options they offer for patients who are self-pay. Some doctors offer discounts or will set up payment plans for their patients who do not have insurance, but you will need to ask so you aren't surprised when you get to your appointment.  2) Contact Your Local Health Department Not all health departments have doctors that can see patients for sick visits, but many do, so it is worth a call to see if yours does. If you don't know where your local health department is, you can check in your phone book. The CDC also has a tool to help you locate your state's health department, and many state websites also have listings  of all of their local health departments.  3) Find a [facility] If your illness is not likely to be very severe or complicated, you may want to try a walk in clinic. These are popping up all over the country in pharmacies, drugstores, and shopping centers. They're usually staffed by nurse practitioners or physician assistants that have been trained to treat common illnesses and complaints. They're usually fairly quick and inexpensive. However, if you have serious medical issues or chronic medical problems, these are  probably not your best option.  No Primary Care Doctor: - Call Health Connect at  [phone] - they can help you locate a primary care doctor that  accepts your insurance, provides certain services, etc. - Physician Referral Service- [phone]  Chronic Pain Problems: Organization         Address  Phone   Notes  [facility]  [phone] Patients need to be referred by their primary care doctor.   Medication Assistance: Organization         Address  Phone   Notes  [facility] Medication [facility] [address] [place], [place] [address] [phone] --Must be a resident of [facility] -- Must have NO insurance coverage whatsoever (no Medicaid/ Medicare, etc.) -- The pt. MUST have a primary care doctor that directs their care regularly and follows them in the community   Med[facility]ist  [phone]   [employer]  [phone]    Agencies that provide inexpensive medical care: Organization         Address  Phone   Notes  [place]  [phone]   [facility]    [phone]   [facility] [place], [place] [address] [phone]   [place] [address], [place] [phone]   Planned Parenthood    [phone]   [facility]    [phone]   [place] and [place] [address], [place] Phone:  [phone], Fax:  [phone] Hours of Operation:  9 am - 6 pm, M-F.  Also accepts Medicaid/Medicare and self-pay.  [facility] for [place] [address], [address], [place] Phone: [phone], Fax: [phone]. Hours of Operation:  8:30 am - 5:30 pm, M-F.  Also accepts Medicaid and self-pay.  [facility] [place] [address], [place] Phone: [phone]   [place], [place], [place] [phone], Ext. [phone] Mondays & Thursdays:  7-9 AM.  First 15 patients are seen on a first come, first serve basis.    [place]eller Providers:  Organization         Address  Phone   Notes  [facility] [address],  [phone] Also accepts self-pay patients.  [facility] [address], [place]  [phone]   [place], [address], [place] [phone]   [facility] [address] [place] [phone]   Lucianne Lei [address], [place]   [phone] Only accepts [place] Access [place] patients after they have their name applied to their card.   Self-Pay (no insurance) in [facility]:  Patent attorney   Notes  Sickle Cell Patients, [facility] Internal Medicine [place] [phone]   [facility] [place] [phone]   [facility] [place]  [place], [address], [place] [phone]   [facility]/Dr. Osei-Bonsu  [address], [place] or [address], [place] [phone] Phone number for both [place] and [place] locations is the same.  [facility] [address] [place] [phone]   [facility] [address] [place] or [address] [phone]   [facility] [address] [place] [phone], phone; [phone], fax Sees patients 1st and 3rd Saturday of every month.  Must not qualify for public or private insurance (i.e. Medicaid, Medicare, [place] Health Choice, Veterans' Benefits)  Household income should be no more than 200% of the poverty level The clinic cannot treat you if you are pregnant or think you are pregnant  Sexually transmitted diseases are not treated at the clinic.     Dental Care: Organization         Address  Phone  Notes  [facility] Department of [facility] [place] [phone] Accepts children up to age 34 who are enrolled in [place] or [place]; pregnant women with a Medicaid card; and children who have applied for Medicaid or [place] Health Choice, but were declined, whose parents can pay a reduced fee at time of service.  [facility] Department of [facility]  [address], [place] [phone] Accepts children up to age 56 who are enrolled in [place] or [place]; pregnant women with a Medicaid card; and children who have applied for Medicaid or [place] Health Choice, but were declined, whose parents can pay a reduced fee at time of service.  [place] [facility] Access PRO[facility]  [place] [phone] Patients are seen by appointment only. Walk-ins are not accepted. Campus will see patients 48 years of age and older. Monday - Tuesday (8am-5pm) Most Wednesdays (8:30-5pm) $30 per visit, cash only  [facility] Adult [facility] Access PRO[facility]  [address], [facility] [phone] Patients are seen by appointment only. Walk-ins are not accepted. [place] will see patients 80 years of age and older. One Wednesday Evening (Monthly: Volunteer Based).  $30 per visit, cash only  [place]  [phone] for adults; Children under age 67, call Graduate Pediatric Dentistry at [phone]. Children aged 22-14, please call [phone] to request a pediatric application.  Dental services are provided in all areas of dental care including fillings, crowns and bridges, complete and partial dentures, implants, gum treatment, root canals, and extractions. Preventive care is also provided. Treatment is provided to both adults and children. Patients are selected via a lottery and there is often a waiting  list.   [facility] [address], [place]  [phone] [email or web address]   [facility] [address] [place], [place] [phone], Ext. [phone] Second and Fourth Thursday of each month, opens at 6:30 AM; Clinic ends at 9 AM.  Patients are seen on a first-come first-served basis, and a limited number are seen during each clinic.   [facility]  [address] Hillard Danker [place], [place] [phone]   Eligibility  Requirements You must have lived in [place], [place], or [place] counties for at least the last three months.   You cannot be eligible for state or federal sponsored [employer], including [employer], [place], or [employer].   You generally cannot be eligible for healthcare insurance through your employer.    How to apply: Eligibility screenings are held every Tuesday and Wednesday afternoon from 1:00 pm until 4:00 pm. You do not need an appointment for the interview!  [facility] [address] [place], [place]   [place]  [place] Department  [place]  [phone]    Behavioral Health Resources in the Community: Intensive Outpatient Programs Organization         Address  Phone  Notes  [place]. [address] [place], [place] [phone]   [facility] Outpatient [address] [place], [place]   ADS: Alcohol & Drug Svcs [address], [place], [place]   [place] [address]  [place], [place] or [phone]   Substance Abuse Resources Organization         Address  Phone  Notes  Alcohol and Drug Services  [phone]   [place]  [phone]   [place]   Chinita Pester  [phone]   Residential & Outpatient Substance Abuse Program   [phone]   Psychological Services Organization         Address  Phone  Notes  [facility] [place]  [place]  [phone]   [place] [address], [place] or [phone]    Mobile Crisis Teams Organization         Address  Phone  Notes  [facility], Mobile Crisis Care Unit  [phone]   [facility]ive Psychotherapeutic Services  [address] [place], [place]   [place] [address] [place] [phone]    Self-Help/Support Groups Organization         Address  Phone             Notes  [place]. of [place] - variety of support groups  [place] Call for more information  Narcotics Anonymous (NA), [facility] [address], [employer] [place]  2 meetings at this location   Special educational needs teacher         Address  Phone  Notes  [facility]idential [facility] [place],    [place]  [phone]   [facility]  [address], [place] [address], [place], [place]   [place], [place] [phone] Admissions: 8am-3pm M-F  Incentives Substance [place] [address]    [place], [place] [phone]   [facility] [address] Jadene Pierini [place], [place]   [facility] [address],  [place], [place]   [facility] - Intensive Outpatient [address], [place], [place]   [facility] [place].,  [place], [place] or [phone]   [facility] (RTS) [address], [place], [place] Accepts Medicaid  [facility] [place] [address],  [place] [phone] Substance Abuse/Addiction Treatment   [facility] Resources Organization  Address  Phone  Notes  °[facility]  [phone]   °Julie Brannon, PhD [address] [place], [place]   ([phone] or ([phone]   °[facility]   [address] °[place], [place] [phone]   °[facility] [address], [place], [place] [phone] Insurance/Medicaid/sponsorship through [facility]  °[facility] [address]                                    [place], [place] [phone] Therapy/tele-psych/case  °[facility] [address]  ° [place], [place] [phone]    °Dr. Arfeen  [phone]   °[facility] [place]  [employer] [place] [facility] 1) [address], [place] °2) [address], [place] °3)  [address], [place] [phone] °([phone] ° °([phone]) [phone]   °[place] Child Abuse Hotline (336) [phone] or (336) [phone] (After Hours)    ° ° ° °

## 2015-08-18 ENCOUNTER — Emergency Department (HOSPITAL_COMMUNITY)
Admission: EM | Admit: 2015-08-18 | Discharge: 2015-08-18 | Disposition: A | Discharge status: Home / Self Care | Payer: Medicaid Other | Attending: Emergency Medicine | Admitting: Emergency Medicine

## 2015-08-18 ENCOUNTER — Encounter (HOSPITAL_COMMUNITY): Payer: Self-pay | Admitting: *Deleted

## 2015-08-18 DIAGNOSIS — Z79899 Other long term (current) drug therapy: Secondary | ICD-10-CM | POA: Insufficient documentation

## 2015-08-18 DIAGNOSIS — J029 Acute pharyngitis, unspecified: Secondary | ICD-10-CM | POA: Insufficient documentation

## 2015-08-18 LAB — RAPID STREP SCREEN (MED CTR MEBANE ONLY): Streptococcus, Group A Screen (Direct): NEGATIVE

## 2015-08-18 NOTE — ED Notes (Signed)
Pt with sore throat since Thurs.

## 2015-08-18 NOTE — Discharge Instructions (Signed)

## 2015-08-18 NOTE — ED Notes (Signed)
Pt c/o sore throat. Rapid strep done at triage. ALSO, c/o right wrist pain x 1 month. No known injury. States has been "playing basketball a lot".

## 2015-08-18 NOTE — ED Provider Notes (Signed)
CSN: [id number]     Arrival date & time 08/18/15  1401 History  By signing my name below, I, Soijett Blue, attest that this documentation has been prepared under the direction and in the presence of Roxy Horsemanobert Masiyah Engen, PA-C Electronically Signed: Soijett Blue, ED Scribe. 08/18/2015. 3:18 PM.   Chief Complaint  Patient presents with  . Sore Throat      The history is provided by the patient. No language interpreter was used.    Logan Simmons is a 24 y.o. male who presents to the Emergency Department complaining of sore throat onset 2-3 days. He states that he is having associated symptoms of painful swallowing. He states that he has tried robitussin with no relief for his symptoms. He denies fever, color change, rash, wound, and any other symptoms.   History reviewed. No pertinent past medical history. History reviewed. No pertinent past surgical history. Family History  Problem Relation Age of Onset  . Heart disease Cousin 9617    Died suddenly during exercise of heart problem- patient does not know what kind.   Social History  Substance Use Topics  . Smoking status: Never Smoker   . Smokeless tobacco: Never Used  . Alcohol Use: Yes     Comment: occ    Review of Systems  Constitutional: Negative for fever.  HENT: Positive for sore throat. Negative for trouble swallowing.   Skin: Negative for color change, rash and wound.    Allergies  Review of patient's allergies indicates no known allergies.  Home Medications   Prior to Admission medications   Medication Sig Start Date End Date Taking? Authorizing Provider  ondansetron (ZOFRAN) 4 MG tablet Take 1 tablet (4 mg total) by mouth every 6 (six) hours. Patient not taking: Reported on 08/12/2015 08/10/15   Eyvonne MechanicJeffrey Hedges, PA-C  pantoprazole (PROTONIX) 20 MG tablet Take 2 tablets (40 mg total) by mouth daily. 08/12/15   Satira SarkNicole Elizabeth Nadeau, PA-C   BP 111/65 mmHg  Pulse 81  Temp(Src) 99.2 F (37.3 C) (Oral)  Resp 16  Ht  5\' 7"  (1.702 m)  Wt 137 lb 12.8 oz (62.506 kg)  BMI 21.58 kg/m2  SpO2 98% Physical Exam  Constitutional: He is oriented to person, place, and time. He appears well-developed and well-nourished. No distress.  HENT:  Head: Normocephalic and atraumatic.  Oropharynx is mildly erythematous, no exudates, no abscess, uvula is midline, airway intact, no stridor  Eyes: EOM are normal.  Neck: Neck supple.  Cardiovascular: Normal rate, regular rhythm and normal heart sounds.  Exam reveals no gallop and no friction rub.   No murmur heard. Pulmonary/Chest: Effort normal and breath sounds normal. No respiratory distress. He has no wheezes. He has no rales.  CTAB  Musculoskeletal: Normal range of motion.  Neurological: He is alert and oriented to person, place, and time.  Skin: Skin is warm and dry.  Psychiatric: He has a normal mood and affect. His behavior is normal.  Nursing note and vitals reviewed.   ED Course  Procedures (including critical care time) DIAGNOSTIC STUDIES: Oxygen Saturation is 98% on RA, nl by my interpretation.    COORDINATION OF CARE: 3:18 PM Discussed treatment plan with pt at bedside which includes take either tylenol or motrin PRN and throat lozenges and pt agreed to plan.   Labs Review Labs Reviewed  RAPID STREP SCREEN (NOT [facility])  CULTURE, GROUP A STREP      MDM   Final diagnoses:  Sore throat    Pt afebrile  without tonsillar exudate, negative strep. Presents with mild cervical lymphadenopathy, & dysphagia; diagnosis of viral pharyngitis. No abx indicated. DC w symptomatic tx for pain  Pt does not appear dehydrated, but did discuss importance of water rehydration. Presentation non concerning for PTA or infxn spread to soft tissue. No trismus or uvula deviation. Specific return precautions discussed. Pt able to drink water in ED without difficulty with intact air way. Recommended PCP follow up.   I personally performed the services described in this  documentation, which was scribed in my presence. The recorded information has been reviewed and is accurate.      Roxy Horseman, PA-C 08/18/15 1529  Blane Ohara, MD 08/20/15 2204

## 2015-08-19 LAB — CULTURE, GROUP A STREP: Strep A Culture: NEGATIVE

## 2015-08-21 LAB — CULTURE, GROUP A STREP: STREP A CULTURE: NEGATIVE

## 2015-08-23 NOTE — ED Provider Notes (Signed)
CSN: [id number]     Arrival date & time 08/17/15  1105 History   First MD Initiated Contact with Patient 08/17/15 1306     Chief Complaint  Patient presents with  . Vomiting  . Sore Throat     (Consider location/radiation/quality/duration/timing/severity/associated sxs/prior Treatment) HPI 24 year old male with sore throat. Onset Thursday. Persistent since then. Worse with swallowing. Occasional nonproductive cough. No acute respiratory complaints. Subjective fever. Mild nausea. Patient was seen in the emergency room about a week ago with abdominal pain and vomiting. The symptoms have been improving. No sick contacts. History reviewed. No pertinent past medical history. History reviewed. No pertinent past surgical history. Family History  Problem Relation Age of Onset  . Heart disease Cousin 4917    Died suddenly during exercise of heart problem- patient does not know what kind.   Social History  Substance Use Topics  . Smoking status: Never Smoker   . Smokeless tobacco: Never Used  . Alcohol Use: Yes     Comment: occ    Review of Systems  All systems reviewed and negative, other than as noted in HPI.   Allergies  Review of patient's allergies indicates no known allergies.  Home Medications   Prior to Admission medications   Medication Sig Start Date End Date Taking? Authorizing Provider  pantoprazole (PROTONIX) 20 MG tablet Take 2 tablets (40 mg total) by mouth daily. 08/12/15  Yes Satira SarkNicole Elizabeth Nadeau, PA-C  ondansetron (ZOFRAN) 4 MG tablet Take 1 tablet (4 mg total) by mouth every 6 (six) hours. Patient not taking: Reported on 08/12/2015 08/10/15   Eyvonne MechanicJeffrey Hedges, PA-C   BP 116/71 mmHg  Pulse 69  Temp(Src) 97.4 F (36.3 C) (Oral)  Resp 14  Ht 5\' 7"  (1.702 m)  Wt 138 lb (62.596 kg)  BMI 21.61 kg/m2  SpO2 100% Physical Exam  Constitutional: He appears well-developed and well-nourished. No distress.  HENT:  Head: Normocephalic and atraumatic.  Mouth/Throat:  No oropharyngeal exudate.  Nonexudative pharyngitis. Uvula midline. Handling secretions. Normal sounding voice. Neck supple. No stridor. Submental tissues are soft.  Eyes: Conjunctivae and EOM are normal. Pupils are equal, round, and reactive to light. Right eye exhibits no discharge. Left eye exhibits no discharge.  Neck: Neck supple.  Cardiovascular: Normal rate, regular rhythm and normal heart sounds.  Exam reveals no gallop and no friction rub.   No murmur heard. Pulmonary/Chest: Effort normal and breath sounds normal. No respiratory distress.  Abdominal: Soft. He exhibits no distension. There is no tenderness.  Musculoskeletal: He exhibits no edema or tenderness.  Neurological: He is alert.  Skin: Skin is warm and dry.  Psychiatric: He has a normal mood and affect. His behavior is normal. Thought content normal.  Nursing note and vitals reviewed.   ED Course  Procedures (including critical care time) Labs Review Labs Reviewed  COMPREHENSIVE METABOLIC PANEL - Abnormal; Notable for the following:    Calcium 8.8 (*)    Total Protein 5.9 (*)    Albumin 3.2 (*)    ALT 7 (*)    All other components within normal limits  URINALYSIS, ROUTINE W REFLEX MICROSCOPIC (NOT AT [facility]) - Abnormal; Notable for the following:    APPearance CLOUDY (*)    All other components within normal limits  RAPID STREP SCREEN (NOT AT [facility])  CULTURE, GROUP A STREP  CBC    Imaging Review No results found. I have personally reviewed and evaluated these images and lab results as part of my medical decision-making.  EKG Interpretation None      MDM   Final diagnoses:  Pharyngitis    25 year old male with likely viral pharyngitis. No evidence of serious to space head/neck infection. Generally well-appearing. No respiratory distress. Plan symptomatic treatment.    Raeford Razor, MD 08/23/15 [phone]

## 2015-08-25 ENCOUNTER — Emergency Department (HOSPITAL_COMMUNITY)
Admission: EM | Admit: 2015-08-25 | Discharge: 2015-08-25 | Disposition: A | Discharge status: Home / Self Care | Payer: Medicaid Other | Attending: Emergency Medicine | Admitting: Emergency Medicine

## 2015-08-25 ENCOUNTER — Encounter (HOSPITAL_COMMUNITY): Payer: Self-pay | Admitting: Emergency Medicine

## 2015-08-25 DIAGNOSIS — R42 Dizziness and giddiness: Secondary | ICD-10-CM | POA: Insufficient documentation

## 2015-08-25 DIAGNOSIS — J019 Acute sinusitis, unspecified: Secondary | ICD-10-CM

## 2015-08-25 DIAGNOSIS — H748X3 Other specified disorders of middle ear and mastoid, bilateral: Secondary | ICD-10-CM | POA: Insufficient documentation

## 2015-08-25 MED ORDER — FLUTICASONE PROPIONATE 50 MCG/ACT NA SUSP
1.0000 | Freq: Two times a day (BID) | NASAL | Status: DC
  Administered 2015-08-25: 1 via NASAL
  Filled 2015-08-25 (×2): qty 16

## 2015-08-25 MED ORDER — DEXAMETHASONE 4 MG PO TABS
10.0000 mg | ORAL_TABLET | Freq: Once | ORAL | Status: AC
Start: 2015-08-25 — End: 2015-08-25
  Administered 2015-08-25: 10 mg via ORAL
  Filled 2015-08-25: qty 3

## 2015-08-25 NOTE — ED Notes (Signed)
Patient here with multiple complaints - states that over the past week, he has had a multitude of cold symptoms, headache, and dizziness when moving too fast.  Patient A&O x 4, ambulatory, appears to be in NAD at this time.

## 2015-08-25 NOTE — Discharge Instructions (Signed)
You were seen today for your on going cough, dizziness, and headache.  These symptoms seem related to your recent viral infections and appear to be secondary to build up of mucus and congestion in your sinuses.  Use the nose spray provided and also use an over the counter saline nasal spray.  You were also given a dose of steroids to help with swelling of your sinuses.  Follow up with a primary care physician outpatient for continued treatment and evaluation.  Sinusitis, Adult Sinusitis is redness, soreness, and inflammation of the paranasal sinuses. Paranasal sinuses are air pockets within the bones of your face. They are located beneath your eyes, in the middle of your forehead, and above your eyes. In healthy paranasal sinuses, mucus is able to drain out, and air is able to circulate through them by way of your nose. However, when your paranasal sinuses are inflamed, mucus and air can become trapped. This can allow bacteria and other germs to grow and cause infection. Sinusitis can develop quickly and last only a short time (acute) or continue over a long period (chronic). Sinusitis that lasts for more than 12 weeks is considered chronic. CAUSES Causes of sinusitis include:  Allergies.  Structural abnormalities, such as displacement of the cartilage that separates your nostrils (deviated septum), which can decrease the air flow through your nose and sinuses and affect sinus drainage.  Functional abnormalities, such as when the small hairs (cilia) that line your sinuses and help remove mucus do not work properly or are not present. SIGNS AND SYMPTOMS Symptoms of acute and chronic sinusitis are the same. The primary symptoms are pain and pressure around the affected sinuses. Other symptoms include:  Upper toothache.  Earache.  Headache.  Bad breath.  Decreased sense of smell and taste.  A cough, which worsens when you are lying flat.  Fatigue.  Fever.  Thick drainage from your nose,  which often is green and may contain pus (purulent).  Swelling and warmth over the affected sinuses. DIAGNOSIS Your health care provider will perform a physical exam. During your exam, your health care provider may perform any of the following to help determine if you have acute sinusitis or chronic sinusitis:  Look in your nose for signs of abnormal growths in your nostrils (nasal polyps).  Tap over the affected sinus to check for signs of infection.  View the inside of your sinuses using an imaging device that has a light attached (endoscope). If your health care provider suspects that you have chronic sinusitis, one or more of the following tests may be recommended:  Allergy tests.  Nasal culture. A sample of mucus is taken from your nose, sent to a lab, and screened for bacteria.  Nasal cytology. A sample of mucus is taken from your nose and examined by your health care provider to determine if your sinusitis is related to an allergy. TREATMENT Most cases of acute sinusitis are related to a viral infection and will resolve on their own within 10 days. Sometimes, medicines are prescribed to help relieve symptoms of both acute and chronic sinusitis. These may include pain medicines, decongestants, nasal steroid sprays, or saline sprays. However, for sinusitis related to a bacterial infection, your health care provider will prescribe antibiotic medicines. These are medicines that will help kill the bacteria causing the infection. Rarely, sinusitis is caused by a fungal infection. In these cases, your health care provider will prescribe antifungal medicine. For some cases of chronic sinusitis, surgery is needed. Generally, these  are cases in which sinusitis recurs more than 3 times per year, despite other treatments. HOME CARE INSTRUCTIONS  Drink plenty of water. Water helps thin the mucus so your sinuses can drain more easily.  Use a humidifier.  Inhale steam 3-4 times a day (for  example, sit in the bathroom with the shower running).  Apply a warm, moist washcloth to your face 3-4 times a day, or as directed by your health care provider.  Use saline nasal sprays to help moisten and clean your sinuses.  Take medicines only as directed by your health care provider.  If you were prescribed either an antibiotic or antifungal medicine, finish it all even if you start to feel better. SEEK IMMEDIATE MEDICAL CARE IF:  You have increasing pain or severe headaches.  You have nausea, vomiting, or drowsiness.  You have swelling around your face.  You have vision problems.  You have a stiff neck.  You have difficulty breathing.   This information is not intended to replace advice given to you by your health care provider. Make sure you discuss any questions you have with your health care provider.   Document Released: 09/16/2005 Document Revised: 10/07/2014 Document Reviewed: 10/01/2011 Elsevier Interactive Patient Education [employer]

## 2015-08-25 NOTE — ED Provider Notes (Signed)
CSN: [id number]     Arrival date & time 08/25/15  [id number] History   First MD Initiated Contact with Patient 08/25/15 [phone]     Chief Complaint  Patient presents with  . Headache  . Cough     (Consider location/radiation/quality/duration/timing/severity/associated sxs/prior Treatment) HPI Comments: 24 y.o. Male with history of allergic rhinitis, multiple ER visits recently presents for headache and cough.  The patient has been here multiple times over the last few weeks with different viral like symptoms.  He says he is back today because he still is not feeling 100% back to himself.  He says that he was going to see a primary care physician but there was going to be a copay and so his mother told him just to go to the ER.  He says that he continues to have intermittent headache in his forehead and soreness in his cheeks.  He also feels congested and has had a non productive cough that hs improved.  The patient states that he felt very hot and had a hard time sleeping.  He says his mother told him to come back in to the ER today for evaluation.  Patient is a 24 y.o. male presenting with headaches and cough.  Headache Associated symptoms: congestion, cough, dizziness and sinus pressure   Associated symptoms: no abdominal pain, no back pain, no diarrhea, no drainage, no fatigue, no fever, no myalgias, no nausea, no numbness, no seizures, no vomiting and no weakness   Cough Associated symptoms: headaches and rhinorrhea   Associated symptoms: no chest pain, no chills, no fever, no myalgias, no rash and no wheezing     History reviewed. No pertinent past medical history. History reviewed. No pertinent past surgical history. Family History  Problem Relation Age of Onset  . Heart disease Cousin 3317    Died suddenly during exercise of heart problem- patient does not know what kind.   Social History  Substance Use Topics  . Smoking status: Never Smoker   . Smokeless tobacco: Never Used  . Alcohol  Use: Yes     Comment: occ    Review of Systems  Constitutional: Negative for fever, chills and fatigue.  HENT: Positive for congestion, rhinorrhea and sinus pressure. Negative for ear discharge, postnasal drip and sneezing.   Respiratory: Positive for cough. Negative for chest tightness and wheezing.   Cardiovascular: Negative for chest pain and palpitations.  Gastrointestinal: Negative for nausea, vomiting, abdominal pain and diarrhea.  Genitourinary: Negative for dysuria, urgency and hematuria.  Musculoskeletal: Negative for myalgias and back pain.  Skin: Negative for rash.  Neurological: Positive for dizziness and headaches. Negative for seizures, weakness and numbness.  Hematological: Negative for adenopathy. Does not bruise/bleed easily.      Allergies  Review of patient's allergies indicates no known allergies.  Home Medications   Prior to Admission medications   Medication Sig Start Date End Date Taking? Authorizing Provider  guaiFENesin (ROBITUSSIN) 100 MG/5ML SOLN Take 5 mLs by mouth every 4 (four) hours as needed for cough or to loosen phlegm.   Yes Historical Provider, MD  ondansetron (ZOFRAN) 4 MG tablet Take 1 tablet (4 mg total) by mouth every 6 (six) hours. Patient not taking: Reported on 08/12/2015 08/10/15   Eyvonne MechanicJeffrey Hedges, PA-C  pantoprazole (PROTONIX) 20 MG tablet Take 2 tablets (40 mg total) by mouth daily. Patient not taking: Reported on 08/25/2015 08/12/15   Satira SarkNicole Elizabeth Nadeau, PA-C   BP 120/96 mmHg  Pulse 68  Temp(Src) 98 F (36.7  C) (Oral)  Resp 16  Ht  (1.702 m)  Wt 137 lb (62.143 kg)  BMI 21.45 kg/m2  SpO2 97% Physical Exam  Constitutional: He is oriented to person, place, and time. He appears well-developed and well-nourished. No distress.  HENT:  Head: Normocephalic and atraumatic.  Right Ear: External ear normal. Tympanic membrane is not bulging. A middle ear effusion is present.  Left Ear: External ear normal. Tympanic membrane is  not bulging. A middle ear effusion is present.  Nose: Mucosal edema present. Right sinus exhibits maxillary sinus tenderness and frontal sinus tenderness. Left sinus exhibits maxillary sinus tenderness and frontal sinus tenderness.  Mouth/Throat: Oropharynx is clear and moist and mucous membranes are normal. No oropharyngeal exudate, posterior oropharyngeal edema, posterior oropharyngeal erythema or tonsillar abscesses.  Poor transillumination of the sinuses.  Post nasal drip over the posterior pharynx.    Eyes: EOM are normal. Pupils are equal, round, and reactive to light.  Neck: Normal range of motion. Neck supple.  Cardiovascular: Normal rate, regular rhythm, normal heart sounds and intact distal pulses.   No murmur heard. Pulmonary/Chest: Effort normal. No respiratory distress. He has no wheezes. He has no rales.  Abdominal: Soft. He exhibits no distension. There is no tenderness.  Musculoskeletal: He exhibits no edema.  Neurological: He is alert and oriented to person, place, and time.  Skin: Skin is warm and dry. No rash noted. He is not diaphoretic.  Vitals reviewed.   ED Course  Procedures (including critical care time) Labs Review Labs Reviewed - No data to display  Imaging Review No results found. I have personally reviewed and evaluated these images and lab results as part of my medical decision-making.   EKG Interpretation None      MDM  Patient seen and evaluated in stable condition.  Patient well appearing.  Benign examination.  Afebrile with normal vital signs.  Physical examination consistent with viral sinusitis/allergic rhinitis.  Normal respiratory examination.  Patient given Decadron and Flonase and instructed to follow up with PCP which was stressed to the patient.  He expressed understanding and agreement with plan of care and was discharged home.  Final diagnoses:  Acute sinusitis, recurrence not specified, unspecified location    1. Sinusitis vs allergic  rhinitis    Leta Baptist, MD 08/25/15 1016

## 2016-02-16 ENCOUNTER — Ambulatory Visit (HOSPITAL_COMMUNITY)
Admission: EM | Admit: 2016-02-16 | Discharge: 2016-02-16 | Disposition: A | Discharge status: Home / Self Care | Payer: Medicaid Other | Attending: Emergency Medicine | Admitting: Emergency Medicine

## 2016-02-16 ENCOUNTER — Encounter (HOSPITAL_COMMUNITY): Payer: Self-pay | Admitting: Emergency Medicine

## 2016-02-16 DIAGNOSIS — J40 Bronchitis, not specified as acute or chronic: Secondary | ICD-10-CM

## 2016-02-16 MED ORDER — AZITHROMYCIN 250 MG PO TABS: ORAL_TABLET | ORAL | Status: DC

## 2016-02-16 MED ORDER — BENZONATATE 100 MG PO CAPS: 100.0000 mg | ORAL_CAPSULE | Freq: Three times a day (TID) | ORAL | Status: DC

## 2016-02-16 MED ORDER — ALBUTEROL SULFATE HFA 108 (90 BASE) MCG/ACT IN AERS: 2.0000 | INHALATION_SPRAY | RESPIRATORY_TRACT | Status: DC | PRN

## 2016-02-16 NOTE — Discharge Instructions (Signed)
Upper Respiratory Infection, Adult °Most upper respiratory infections (URIs) are a viral infection of the air passages leading to the lungs. A URI affects the nose, throat, and upper air passages. The most common type of URI is nasopharyngitis and is typically referred to as "the common cold." °URIs run their course and usually go away on their own. Most of the time, a URI does not require medical attention, but sometimes a bacterial infection in the upper airways can follow a viral infection. This is called a secondary infection. Sinus and middle ear infections are common types of secondary upper respiratory infections. °Bacterial pneumonia can also complicate a URI. A URI can worsen asthma and chronic obstructive pulmonary disease (COPD). Sometimes, these complications can require emergency medical care and may be life threatening.  °CAUSES °Almost all URIs are caused by viruses. A virus is a type of germ and can spread from one person to another.  °RISKS FACTORS °You may be at risk for a URI if:  °· You smoke.   °· You have chronic heart or lung disease. °· You have a weakened defense (immune) system.   °· You are very young or very old.   °· You have nasal allergies or asthma. °· You work in crowded or poorly ventilated areas. °· You work in health care facilities or schools. °SIGNS AND SYMPTOMS  °Symptoms typically develop 2-3 days after you come in contact with a cold virus. Most viral URIs last 7-10 days. However, viral URIs from the influenza virus (flu virus) can last 14-18 days and are typically more severe. Symptoms may include:  °· Runny or stuffy (congested) nose.   °· Sneezing.   °· Cough.   °· Sore throat.   °· Headache.   °· Fatigue.   °· Fever.   °· Loss of appetite.   °· Pain in your forehead, behind your eyes, and over your cheekbones (sinus pain). °· Muscle aches.   °DIAGNOSIS  °Your health care provider may diagnose a URI by: °· Physical exam. °· Tests to check that your symptoms are not due to  another condition such as: °· Strep throat. °· Sinusitis. °· Pneumonia. °· Asthma. °TREATMENT  °A URI goes away on its own with time. It cannot be cured with medicines, but medicines may be prescribed or recommended to relieve symptoms. Medicines may help: °· Reduce your fever. °· Reduce your cough. °· Relieve nasal congestion. °HOME CARE INSTRUCTIONS  °· Take medicines only as directed by your health care provider.   °· Gargle warm saltwater or take cough drops to comfort your throat as directed by your health care provider. °· Use a warm mist humidifier or inhale steam from a shower to increase air moisture. This may make it easier to breathe. °· Drink enough fluid to keep your urine clear or pale yellow.   °· Eat soups and other clear broths and maintain good nutrition.   °· Rest as needed.   °· Return to work when your temperature has returned to normal or as your health care provider advises. You may need to stay home longer to avoid infecting others. You can also use a face mask and careful hand washing to prevent spread of the virus. °· Increase the usage of your inhaler if you have asthma.   °· Do not use any tobacco products, including cigarettes, chewing tobacco, or electronic cigarettes. If you need help quitting, ask your health care provider. °PREVENTION  °The best way to protect yourself from getting a cold is to practice good hygiene.  °· Avoid oral or hand contact with people with cold   symptoms.   °· Wash your hands often if contact occurs.   °There is no clear evidence that vitamin C, vitamin E, echinacea, or exercise reduces the chance of developing a cold. However, it is always recommended to get plenty of rest, exercise, and practice good nutrition.  °SEEK MEDICAL CARE IF:  °· You are getting worse rather than better.   °· Your symptoms are not controlled by medicine.   °· You have chills. °· You have worsening shortness of breath. °· You have brown or red mucus. °· You have yellow or brown nasal  discharge. °· You have pain in your face, especially when you bend forward. °· You have a fever. °· You have swollen neck glands. °· You have pain while swallowing. °· You have white areas in the back of your throat. °SEEK IMMEDIATE MEDICAL CARE IF:  °· You have severe or persistent: °¨ Headache. °¨ Ear pain. °¨ Sinus pain. °¨ Chest pain. °· You have chronic lung disease and any of the following: °¨ Wheezing. °¨ Prolonged cough. °¨ Coughing up blood. °¨ A change in your usual mucus. °· You have a stiff neck. °· You have changes in your: °¨ Vision. °¨ Hearing. °¨ Thinking. °¨ Mood. °MAKE SURE YOU:  °· Understand these instructions. °· Will watch your condition. °· Will get help right away if you are not doing well or get worse. °  °This information is not intended to replace advice given to you by your health care provider. Make sure you discuss any questions you have with your health care provider. °  °Document Released: 03/12/2001 Document Revised: 01/31/2015 Document Reviewed: 12/22/2013 °Elsevier Interactive Patient Education ©2016 [employer] ° °How to Use an Inhaler °Proper inhaler technique is very important. Good technique ensures that the medicine reaches the lungs. Poor technique results in depositing the medicine on the tongue and back of the throat rather than in the airways. If you do not use the inhaler with good technique, the medicine will not help you. °STEPS TO FOLLOW IF USING AN INHALER WITHOUT AN EXTENSION TUBE °· Remove the cap from the inhaler. °· If you are using the inhaler for the first time, you will need to prime it. Shake the inhaler for 5 seconds and release four puffs into the air, away from your face. Ask your health care provider or pharmacist if you have questions about priming your inhaler. °· Shake the inhaler for 5 seconds before each breath in (inhalation). °· Position the inhaler so that the top of the canister faces up. °· Put your index finger on the top of the medicine  canister. Your thumb supports the bottom of the inhaler. °· Open your mouth. °· Either place the inhaler between your teeth and place your lips tightly around the mouthpiece, or hold the inhaler 1-2 inches away from your open mouth. If you are unsure of which technique to use, ask your health care provider. °· Breathe out (exhale) normally and as completely as possible. °· Press the canister down with your index finger to release the medicine. °· At the same time as the canister is pressed, inhale deeply and slowly until your lungs are completely filled. This should take 4-6 seconds. Keep your tongue down. °· Hold the medicine in your lungs for 5-10 seconds (10 seconds is best). This helps the medicine get into the small airways of your lungs. °· Breathe out slowly, through pursed lips. Whistling is an example of pursed lips. °· Wait at least 15-30 seconds between puffs. Continue   with the above steps until you have taken the number of puffs your health care provider has ordered. Do not use the inhaler more than your health care provider tells you. °· Replace the cap on the inhaler. °· Follow the directions from your health care provider or the inhaler insert for cleaning the inhaler. °STEPS TO FOLLOW IF USING AN INHALER WITH AN EXTENSION (SPACER) °· Remove the cap from the inhaler. °· If you are using the inhaler for the first time, you will need to prime it. Shake the inhaler for 5 seconds and release four puffs into the air, away from your face. Ask your health care provider or pharmacist if you have questions about priming your inhaler. °· Shake the inhaler for 5 seconds before each breath in (inhalation). °· Place the open end of the spacer onto the mouthpiece of the inhaler. °· Position the inhaler so that the top of the canister faces up and the spacer mouthpiece faces you. °· Put your index finger on the top of the medicine canister. Your thumb supports the bottom of the inhaler and the spacer. °· Breathe out  (exhale) normally and as completely as possible. °· Immediately after exhaling, place the spacer between your teeth and into your mouth. Close your lips tightly around the spacer. °· Press the canister down with your index finger to release the medicine. °· At the same time as the canister is pressed, inhale deeply and slowly until your lungs are completely filled. This should take 4-6 seconds. Keep your tongue down and out of the way. °· Hold the medicine in your lungs for 5-10 seconds (10 seconds is best). This helps the medicine get into the small airways of your lungs. Exhale. °· Repeat inhaling deeply through the spacer mouthpiece. Again hold that breath for up to 10 seconds (10 seconds is best). Exhale slowly. If it is difficult to take this second deep breath through the spacer, breathe normally several times through the spacer. Remove the spacer from your mouth. °· Wait at least 15-30 seconds between puffs. Continue with the above steps until you have taken the number of puffs your health care provider has ordered. Do not use the inhaler more than your health care provider tells you. °· Remove the spacer from the inhaler, and place the cap on the inhaler. °· Follow the directions from your health care provider or the inhaler insert for cleaning the inhaler and spacer. °If you are using different kinds of inhalers, use your quick relief medicine to open the airways 10-15 minutes before using a steroid if instructed to do so by your health care provider. If you are unsure which inhalers to use and the order of using them, ask your health care provider, nurse, or respiratory therapist. °If you are using a steroid inhaler, always rinse your mouth with water after your last puff, then gargle and spit out the water. Do not swallow the water. °AVOID: °· Inhaling before or after starting the spray of medicine. It takes practice to coordinate your breathing with triggering the spray. °· Inhaling through the nose  (rather than the mouth) when triggering the spray. °HOW TO DETERMINE IF YOUR INHALER IS FULL OR NEARLY EMPTY °You cannot know when an inhaler is empty by shaking it. A few inhalers are now being made with dose counters. Ask your health care provider for a prescription that has a dose counter if you feel you need that extra help. If your inhaler does not have a counter,   ask your health care provider to help you determine the date you need to refill your inhaler. Write the refill date on a calendar or your inhaler canister. Refill your inhaler 7-10 days before it runs out. Be sure to keep an adequate supply of medicine. This includes making sure it is not expired, and that you have a spare inhaler.  °SEEK MEDICAL CARE IF:  °· Your symptoms are only partially relieved with your inhaler. °· You are having trouble using your inhaler. °· You have some increase in phlegm. °SEEK IMMEDIATE MEDICAL CARE IF:  °· You feel little or no relief with your inhalers. You are still wheezing and are feeling shortness of breath or tightness in your chest or both. °· You have dizziness, headaches, or a fast heart rate. °· You have chills, fever, or night sweats. °· You have a noticeable increase in phlegm production, or there is blood in the phlegm. °MAKE SURE YOU:  °· Understand these instructions. °· Will watch your condition. °· Will get help right away if you are not doing well or get worse. °  °This information is not intended to replace advice given to you by your health care provider. Make sure you discuss any questions you have with your health care provider. °  °Document Released: 09/13/2000 Document Revised: 07/07/2013 Document Reviewed: 04/15/2013 °Elsevier Interactive Patient Education ©2016 [employer] °Acute Bronchitis °Bronchitis is when the airways that extend from the windpipe into the lungs get red, puffy, and painful (inflamed). Bronchitis often causes thick spit (mucus) to develop. This leads to a cough. A cough is  the most common symptom of bronchitis. °In acute bronchitis, the condition usually begins suddenly and goes away over time (usually in 2 weeks). Smoking, allergies, and asthma can make bronchitis worse. Repeated episodes of bronchitis may cause more lung problems. °HOME CARE °· Rest. °· Drink enough fluids to keep your pee (urine) clear or pale yellow (unless you need to limit fluids as told by your doctor). °· Only take over-the-counter or prescription medicines as told by your doctor. °· Avoid smoking and secondhand smoke. These can make bronchitis worse. If you are a smoker, think about using nicotine gum or skin patches. Quitting smoking will help your lungs heal faster. °· Reduce the chance of getting bronchitis again by: °¨ Washing your hands often. °¨ Avoiding people with cold symptoms. °¨ Trying not to touch your hands to your mouth, nose, or eyes. °· Follow up with your doctor as told. °GET HELP IF: °Your symptoms do not improve after 1 week of treatment. Symptoms include: °· Cough. °· Fever. °· Coughing up thick spit. °· Body aches. °· Chest congestion. °· Chills. °· Shortness of breath. °· Sore throat. °GET HELP RIGHT AWAY IF:  °· You have an increased fever. °· You have chills. °· You have severe shortness of breath. °· You have bloody thick spit (sputum). °· You throw up (vomit) often. °· You lose too much body fluid (dehydration). °· You have a severe headache. °· You faint. °MAKE SURE YOU:  °· Understand these instructions. °· Will watch your condition. °· Will get help right away if you are not doing well or get worse. °  °This information is not intended to replace advice given to you by your health care provider. Make sure you discuss any questions you have with your health care provider. °  °Document Released: 03/04/2008 Document Revised: 05/19/2013 Document Reviewed: 03/09/2013 °Elsevier Interactive Patient Education ©2016 [employer] ° °

## 2016-02-16 NOTE — ED Notes (Signed)
Patient has documented on information form that he is "sick" that it started 5/15.  Barbra SarksFrank Patrick, PA saw the patient.

## 2016-02-17 NOTE — ED Provider Notes (Signed)
CSN: [id number]     Arrival date & time 02/16/16  1948 History   First MD Initiated Contact with Patient 02/16/16 2040     Chief Complaint  Patient presents with  . Cough   (Consider location/radiation/quality/duration/timing/severity/associated sxs/prior Treatment) HPI History obtained from patient:  Pt presents with the cc YQ:MVHQI Duration of symptoms:Several days Treatment  prior to arrival: Over-the-counter medications without relief Context: Sudden onset of symptoms cough, body aches, sore throat Other symptoms include: Sore throat body aches Pain score: 3 from coughing FAMILY HISTORY heart disease in his cousin: SOCIAL HISTORY: Nonsmoker  History reviewed. No pertinent past medical history. History reviewed. No pertinent past surgical history. Family History  Problem Relation Age of Onset  . Heart disease Cousin 42    Died suddenly during exercise of heart problem- patient does not know what kind.   Social History  Substance Use Topics  . Smoking status: Never Smoker   . Smokeless tobacco: Never Used  . Alcohol Use: Yes     Comment: occ    Review of Systems  Denies: HEADACHE, NAUSEA, ABDOMINAL PAIN, CHEST PAIN, CONGESTION, DYSURIA, SHORTNESS OF BREATH  Allergies  Review of patient's allergies indicates no known allergies.  Home Medications   Prior to Admission medications   Medication Sig Start Date End Date Taking? Authorizing Provider  albuterol (PROVENTIL HFA;VENTOLIN HFA) 108 (90 Base) MCG/ACT inhaler Inhale 2 puffs into the lungs every 4 (four) hours as needed for wheezing or shortness of breath. 02/16/16   Tharon Aquas, PA  azithromycin (ZITHROMAX Z-PAK) 250 MG tablet 2 tablets day 1 then 1 tablet each day until gone 02/16/16   Tharon Aquas, PA  benzonatate (TESSALON) 100 MG capsule Take 1 capsule (100 mg total) by mouth every 8 (eight) hours. 02/16/16   Tharon Aquas, PA  guaiFENesin (ROBITUSSIN) 100 MG/5ML SOLN Take 5 mLs by mouth every 4 (four)  hours as needed for cough or to loosen phlegm.    Historical Provider, MD  ondansetron (ZOFRAN) 4 MG tablet Take 1 tablet (4 mg total) by mouth every 6 (six) hours. Patient not taking: Reported on 08/12/2015 08/10/15   Eyvonne Mechanic, PA-C  pantoprazole (PROTONIX) 20 MG tablet Take 2 tablets (40 mg total) by mouth daily. Patient not taking: Reported on 08/25/2015 08/12/15   Barrett Henle, PA-C   Meds Ordered and Administered this Visit  Medications - No data to display  BP 124/84 mmHg  Pulse 92  Temp(Src) 100.6 F (38.1 C) (Oral)  Resp 16  SpO2 97% No data found.   Physical Exam NURSES NOTES AND VITAL SIGNS REVIEWED. CONSTITUTIONAL: Well developed, well nourished, no acute distress HEENT: normocephalic, atraumatic EYES: Conjunctiva normal NECK:normal ROM, supple, no adenopathy PULMONARY:No respiratory distress, normal effort, few diffuse wheezes ABDOMINAL: Soft, ND, NT BS+, No CVAT MUSCULOSKELETAL: Normal ROM of all extremities,  SKIN: warm and dry without rash PSYCHIATRIC: Mood and affect, behavior are normal  ED Course  Procedures (including critical care time)  Labs Review Labs Reviewed - No data to display  Imaging Review No results found.   Visual Acuity Review  Right Eye Distance:   Left Eye Distance:   Bilateral Distance:    Right Eye Near:   Left Eye Near:    Bilateral Near:       RX zpak, albuterol tessalon  MDM   1. Bronchitis     Patient is reassured that there are no issues that require transfer to higher level of care at this time  or additional tests. Patient is advised to continue home symptomatic treatment. Patient is advised that if there are new or worsening symptoms to attend the emergency department, contact primary care provider, or return to [facility]. Instructions of care provided discharged home in stable condition.    THIS NOTE WAS GENERATED USING A VOICE RECOGNITION SOFTWARE PROGRAM. ALL REASONABLE EFFORTS  WERE MADE TO  PROOFREAD THIS DOCUMENT FOR ACCURACY.  I have verbally reviewed the discharge instructions with the patient. A printed AVS was given to the patient.  All questions were answered prior to discharge.      Tharon AquasFrank C Patrick, PA 02/17/16 1600

## 2018-07-10 ENCOUNTER — Other Ambulatory Visit: Payer: Self-pay

## 2018-07-10 ENCOUNTER — Encounter (HOSPITAL_COMMUNITY): Payer: Self-pay

## 2018-07-10 ENCOUNTER — Emergency Department (HOSPITAL_COMMUNITY)
Admission: EM | Admit: 2018-07-10 | Discharge: 2018-07-11 | Disposition: A | Discharge status: Home / Self Care | Payer: Self-pay | Attending: Emergency Medicine | Admitting: Emergency Medicine

## 2018-07-10 DIAGNOSIS — G44009 Cluster headache syndrome, unspecified, not intractable: Secondary | ICD-10-CM | POA: Insufficient documentation

## 2018-07-10 NOTE — ED Triage Notes (Signed)
Pt here for a headache for the past 3 days.  Tried ibuprofen and did not work.  No visual changes, no problem walking or eating.  Told to come to ED to get cleared to work.

## 2018-07-11 MED ORDER — SUMATRIPTAN SUCCINATE 50 MG PO TABS: 50.0000 mg | ORAL_TABLET | Freq: Once | ORAL | 0 refills | Status: DC

## 2018-07-11 MED ORDER — PREDNISONE 20 MG PO TABS
60.0000 mg | ORAL_TABLET | Freq: Once | ORAL | Status: AC
  Administered 2018-07-11: 60 mg via ORAL
  Filled 2018-07-11: qty 3

## 2018-07-11 MED ORDER — PREDNISONE 10 MG (21) PO TBPK: ORAL_TABLET | ORAL | 0 refills | Status: DC

## 2018-07-11 NOTE — Discharge Instructions (Signed)
Get help right away if: You faint. You have weakness or numbness, especially on one side of your body or face. You have double vision. You have nausea or vomiting that does not go away within several hours. You have trouble talking, walking, or keeping your balance. You have pain or stiffness in your neck. You have a fever.

## 2018-07-11 NOTE — ED Provider Notes (Signed)
[facility] EMERGENCY DEPARTMENT Provider Note   CSN: [id number] Arrival date & time: 07/10/18  2226     History   Chief Complaint Chief Complaint  Patient presents with  . Headache    HPI Logan Simmons is a 27 y.o. male who presents the emergency department chief complaint of headache.  Patient has no previous history of headaches.  Over the past 4 days he has had intermittent bouts of severe unilateral headache.  He states that he was working on a machine at work when he had sudden onset of the first headache behind his left eye with associated eye tearing and nasal drainage.  He states that the headache lasted about 5 minutes but was severe and dropped him to his knees.  Since that time he has had repeat intermittent headaches sometimes on the right sometimes on the left only lasting about 3 to 5 minutes at a time always with associated eye drainage and nasal drainage.  Patient denies any current HA. He denies any  personal or family history of headaches.  He states he does not know what his family history is. Denies photophobia, phonophobia, UL throbbing, N/V, visual changes, stiff neck, neck pain, rash, or "thunderclap" onset.    HPI  History reviewed. No pertinent past medical history.  Patient Active Problem List   Diagnosis Date Noted  . Dizziness 06/26/2012  . Back pain 06/26/2012  . FLANK PAIN, RIGHT 12/14/2009  . RHINITIS, ALLERGIC 11/27/2006    History reviewed. No pertinent surgical history.      Home Medications    Prior to Admission medications   Medication Sig Start Date End Date Taking? Authorizing Provider  albuterol (PROVENTIL HFA;VENTOLIN HFA) 108 (90 Base) MCG/ACT inhaler Inhale 2 puffs into the lungs every 4 (four) hours as needed for wheezing or shortness of breath. 02/16/16   Tharon Aquas, PA  azithromycin (ZITHROMAX Z-PAK) 250 MG tablet 2 tablets day 1 then 1 tablet each day until gone 02/16/16   Tharon Aquas, PA    benzonatate (TESSALON) 100 MG capsule Take 1 capsule (100 mg total) by mouth every 8 (eight) hours. 02/16/16   Tharon Aquas, PA  guaiFENesin (ROBITUSSIN) 100 MG/5ML SOLN Take 5 mLs by mouth every 4 (four) hours as needed for cough or to loosen phlegm.    [provider]  ondansetron (ZOFRAN) 4 MG tablet Take 1 tablet (4 mg total) by mouth every 6 (six) hours. Patient not taking: Reported on 08/12/2015 08/10/15   Hedges, Tinnie Gens, PA-C  pantoprazole (PROTONIX) 20 MG tablet Take 2 tablets (40 mg total) by mouth daily. Patient not taking: Reported on 08/25/2015 08/12/15   Barrett Henle, PA-C  predniSONE (STERAPRED UNI-PAK 21 TAB) 10 MG (21) TBPK tablet Use as directed 07/11/18   Arthor Captain, PA-C  SUMAtriptan (IMITREX) 50 MG tablet Take 1 tablet (50 mg total) by mouth once for 1 dose. May repeat in 2 hours if headache persists or recurs.  Do not exceed 200 mg (4 tablets) in 24 hours 07/11/18 07/11/18  Arthor Captain, PA-C    Family History Family History  Problem Relation Age of Onset  . Heart disease Cousin 51       Died suddenly during exercise of heart problem- patient does not know what kind.    Social History Social History   Tobacco Use  . Smoking status: Never Smoker  . Smokeless tobacco: Never Used  Substance Use Topics  . Alcohol use: Yes  Comment: occ  . Drug use: No     Allergies   Patient has no known allergies.   Review of Systems Review of Systems  Ten systems reviewed and are negative for acute change, except as noted in the HPI.   Physical Exam Updated Vital Signs BP 118/73 (BP Location: Right Arm)   Pulse 65   Temp 97.8 F (36.6 C) (Oral)   Resp 18   Ht 5\' 7"  (1.702 m)   Wt 63.5 kg   SpO2 100%   BMI 21.93 kg/m   Physical Exam  Constitutional: He is oriented to person, place, and time. He appears well-developed and well-nourished. No distress.  HENT:  Head: Normocephalic and atraumatic.  Mouth/Throat: Oropharynx is  clear and moist.  Eyes: Pupils are equal, round, and reactive to light. Conjunctivae and EOM are normal. No scleral icterus.  No horizontal, vertical or rotational nystagmus  Neck: Normal range of motion. Neck supple.  Full active and passive ROM without pain No midline or paraspinal tenderness No nuchal rigidity or meningeal signs  Cardiovascular: Normal rate, regular rhythm and intact distal pulses.  Pulmonary/Chest: Effort normal and breath sounds normal. No respiratory distress. He has no wheezes. He has no rales.  Abdominal: Soft. Bowel sounds are normal. There is no tenderness. There is no rebound and no guarding.  Musculoskeletal: Normal range of motion.  Lymphadenopathy:    He has no cervical adenopathy.  Neurological: He is alert and oriented to person, place, and time. No cranial nerve deficit. He exhibits normal muscle tone. Coordination normal. GCS eye subscore is 4. GCS verbal subscore is 5. GCS motor subscore is 6.  Mental Status:  Alert, oriented, thought content appropriate. Speech fluent without evidence of aphasia. Able to follow 2 step commands without difficulty.  Cranial Nerves:  II:  Peripheral visual fields grossly normal, pupils equal, round, reactive to light III,IV, VI: ptosis not present, extra-ocular motions intact bilaterally  V,VII: smile symmetric, facial light touch sensation equal VIII: hearing grossly normal bilaterally  IX,X: midline uvula rise  XI: bilateral shoulder shrug equal and strong XII: midline tongue extension  Motor:  5/5 in upper and lower extremities bilaterally including strong and equal grip strength and dorsiflexion/plantar flexion Sensory: Pinprick and light touch normal in all extremities.  Cerebellar: normal finger-to-nose with bilateral upper extremities Gait: normal gait and balance CV: distal pulses palpable throughout   Skin: Skin is warm and dry. No rash noted. He is not diaphoretic.  Psychiatric: He has a normal mood and  affect. His behavior is normal. Judgment and thought content normal.  Nursing note and vitals reviewed.    ED Treatments / Results  Labs (all labs ordered are listed, but only abnormal results are displayed) Labs Reviewed - No data to display  EKG None  Radiology No results found.  Procedures Procedures (including critical care time)  Medications Ordered in ED Medications  predniSONE (DELTASONE) tablet 60 mg (has no administration in time range)     Initial Impression / Assessment and Plan / ED Course  I have reviewed the triage vital signs and the nursing notes.  Pertinent labs & imaging results that were available during my care of the patient were reviewed by me and considered in my medical decision making (see chart for details).     Patient with suspected diagnosis of cluster headaches.  I discussed the case with Dr. Erma Heritage who is in agreement.  I discussed the diagnosis with the patient.  Patient will have health insurance  in the next month and would prefer to treat headaches currently and then follow-up with neurology outpatient.  Do not feel he needs any emergent imaging at this time.  He has a normal neurologic exam.  I doubt that with recurrent headaches he would have subarachnoid.  He has no evidence of meningitis.  I have no suspicion for venous sinus thrombosis.  He has no headache at this time.  Will treat with prednisone and Imitrex.  Patient understands reasons to seek immediate care the emergency department appears otherwise appropriate for discharge at this time  Final Clinical Impressions(s) / ED Diagnoses   Final diagnoses:  Cluster headache, not intractable, unspecified chronicity pattern    ED Discharge Orders         Ordered    predniSONE (STERAPRED UNI-PAK 21 TAB) 10 MG (21) TBPK tablet     07/11/18 0036    SUMAtriptan (IMITREX) 50 MG tablet   Once     07/11/18 0036           Arthor Captain, PA-C 07/11/18 0134    Shaune Pollack,  MD 07/11/18 [phone]

## 2018-07-16 ENCOUNTER — Ambulatory Visit (HOSPITAL_COMMUNITY)
Admission: EM | Admit: 2018-07-16 | Discharge: 2018-07-16 | Disposition: A | Discharge status: Home / Self Care | Payer: Self-pay | Attending: Family Medicine | Admitting: Family Medicine

## 2018-07-16 ENCOUNTER — Encounter (HOSPITAL_COMMUNITY): Payer: Self-pay

## 2018-07-16 DIAGNOSIS — R040 Epistaxis: Secondary | ICD-10-CM

## 2018-07-16 NOTE — ED Triage Notes (Signed)
Pt needs a note to got back to work because he had a nose bleed at work today.

## 2018-07-16 NOTE — ED Provider Notes (Signed)
[facility] CARE CENTER   [id number] 07/16/18 Arrival Time: 1938  CC: Nose bleed  SUBJECTIVE: History from: patient.  Logan Simmons is a 27 y.o. male who presents with nose bleed that occurred earlier today and lasted for approximately 5 seconds.  Denies a precipitating event or trauma.  Symptoms resolve with occluding nostril with tissue.  Denies previous symptoms in the past.  Denies fever, chills, lightheadedness, dizziness, fatigue, sinus pain, sore throat, SOB, wheezing, chest pain, nausea, changes in bowel or bladder habits.    ROS: As per HPI.  History reviewed. No pertinent past medical history. History reviewed. No pertinent surgical history. No Known Allergies No current facility-administered medications on file prior to encounter.    Current Outpatient Medications on File Prior to Encounter  Medication Sig Dispense Refill  . albuterol (PROVENTIL HFA;VENTOLIN HFA) 108 (90 Base) MCG/ACT inhaler Inhale 2 puffs into the lungs every 4 (four) hours as needed for wheezing or shortness of breath. 1 Inhaler 0  . azithromycin (ZITHROMAX Z-PAK) 250 MG tablet 2 tablets day 1 then 1 tablet each day until gone 6 tablet 0  . benzonatate (TESSALON) 100 MG capsule Take 1 capsule (100 mg total) by mouth every 8 (eight) hours. 21 capsule 0  . guaiFENesin (ROBITUSSIN) 100 MG/5ML SOLN Take 5 mLs by mouth every 4 (four) hours as needed for cough or to loosen phlegm.    . ondansetron (ZOFRAN) 4 MG tablet Take 1 tablet (4 mg total) by mouth every 6 (six) hours. (Patient not taking: Reported on 08/12/2015) 12 tablet 0  . pantoprazole (PROTONIX) 20 MG tablet Take 2 tablets (40 mg total) by mouth daily. (Patient not taking: Reported on 08/25/2015) 30 tablet 0  . predniSONE (STERAPRED UNI-PAK 21 TAB) 10 MG (21) TBPK tablet Use as directed 21 tablet 0  . SUMAtriptan (IMITREX) 50 MG tablet Take 1 tablet (50 mg total) by mouth once for 1 dose. May repeat in 2 hours if headache persists or recurs.  Do not  exceed 200 mg (4 tablets) in 24 hours 1 tablet 0   Social History   Socioeconomic History  . Marital status: Single    Spouse name: Not on file  . Number of children: Not on file  . Years of education: Not on file  . Highest education level: Not on file  Occupational History  . Not on file  Social Needs  . Financial resource strain: Not on file  . Food insecurity:    Worry: Not on file    Inability: Not on file  . Transportation needs:    Medical: Not on file    Non-medical: Not on file  Tobacco Use  . Smoking status: Never Smoker  . Smokeless tobacco: Never Used  Substance and Sexual Activity  . Alcohol use: Yes    Comment: occ  . Drug use: No  . Sexual activity: Yes    Birth control/protection: Condom  Lifestyle  . Physical activity:    Days per week: Not on file    Minutes per session: Not on file  . Stress: Not on file  Relationships  . Social connections:    Talks on phone: Not on file    Gets together: Not on file    Attends religious service: Not on file    Active member of club or organization: Not on file    Attends meetings of clubs or organizations: Not on file    Relationship status: Not on file  . Intimate partner violence:  Fear of current or ex partner: Not on file    Emotionally abused: Not on file    Physically abused: Not on file    Forced sexual activity: Not on file  Other Topics Concern  . Not on file  Social History Narrative  . Not on file   Family History  Problem Relation Age of Onset  . Heart disease Cousin 6       Died suddenly during exercise of heart problem- patient does not know what kind.    OBJECTIVE:  Vitals:   07/16/18 2000 07/16/18 2002  BP: 116/69   Pulse: 87   Resp: 18   Temp: 98.5 F (36.9 C)   SpO2: 100%   Weight:  140 lb (63.5 kg)     General appearance: alert; nontoxic HEENT: Ears: EACs clear, TMs pearly gray with visible cone of light, without erythema; Eyes: PERRL, EOMI grossly; Nose: nares appear  mildly erythematous without obvious blood; Throat: oropharynx clear, tonsils not enlarged or erythematous uvula midline Neck: supple without LAD Lungs: CTAB Heart: regular rate and rhythm.  Radial pulses 2+ symmetrical bilaterally Skin: warm and dry Psychological: alert and cooperative; normal mood and affect  ASSESSMENT & PLAN:  1. Epistaxis    Nosebleed resolved.   Okay to return to work tomorrow Follow up if symptoms recur Return or go to the ED if you have any new or worsening symptoms.   Reviewed expectations re: course of current medical issues. Questions answered. Outlined signs and symptoms indicating need for more acute intervention. Patient verbalized understanding. After Visit Summary given.         Rennis Harding, PA-C 07/16/18 2012

## 2018-07-16 NOTE — Discharge Instructions (Addendum)
Nosebleed resolved.   Okay to return to work tomorrow Follow up if symptoms recur Return or go to the ED if you have any new or worsening symptoms.

## 2018-08-01 ENCOUNTER — Emergency Department (HOSPITAL_COMMUNITY)
Admission: EM | Admit: 2018-08-01 | Discharge: 2018-08-01 | Disposition: A | Discharge status: Home / Self Care | Payer: Self-pay | Attending: Emergency Medicine | Admitting: Emergency Medicine

## 2018-08-01 ENCOUNTER — Encounter (HOSPITAL_COMMUNITY): Payer: Self-pay | Admitting: Emergency Medicine

## 2018-08-01 DIAGNOSIS — G44009 Cluster headache syndrome, unspecified, not intractable: Secondary | ICD-10-CM

## 2018-08-01 DIAGNOSIS — Z72820 Sleep deprivation: Secondary | ICD-10-CM

## 2018-08-01 DIAGNOSIS — R040 Epistaxis: Secondary | ICD-10-CM

## 2018-08-01 HISTORY — DX: Cluster headache syndrome, unspecified, not intractable: G44.009

## 2018-08-01 MED ORDER — CETIRIZINE HCL 10 MG PO TABS: 10.0000 mg | ORAL_TABLET | Freq: Every day | ORAL | 0 refills | Status: DC

## 2018-08-01 NOTE — Discharge Instructions (Signed)
It is important that you are getting 6 to 8 hours of sleep at night. Make sure you are staying well-hydrated with water. You may try cetirizine daily to help with nasal congestion and mucus. If you have a headache, take Tylenol and ibuprofen as needed. Follow-up with [place] [facility] for further management of your headaches.  Call on Monday to set up an appointment. Return to the emergency room with any new, worsening, or concerning symptoms.

## 2018-08-01 NOTE — ED Triage Notes (Signed)
Reports having a nosebleed at work that lasted 15-20 seconds and felt light headed. UC told him it was probably sinus issues but thinks it may be more.

## 2018-08-02 NOTE — ED Provider Notes (Signed)
[facility] EMERGENCY DEPARTMENT Provider Note   CSN: [id number] Arrival date & time: 08/01/18  2143     History   Chief Complaint Chief Complaint  Patient presents with  . Epistaxis    HPI Logan Simmons is a 27 y.o. male presenting for evaluation of nosebleeds and headaches.  Patient states for the past 2 weeks, he has been having intermittent nosebleeds.  The last for several seconds, then resolved without intervention.  Additionally, he is having frontal headaches with associated eye tearing and nasal congestion.  Is also worsened 2 weeks ago.  He has been seen in the ER for this, diagnosed with cluster headaches.  He has also been evaluated at urgent care, where it was thought this may be sinus related.  He was given prednisone, has had no improvement of his symptoms.  He was told to use a nasal spray, but did not want to do so.  He has not taken anything for his headaches including Tylenol or ibuprofen.  Patient states he has been working double shifts these past 2 weeks, sleeping 3 to 4 hours a night.  Patient states he feels very tired.  He feels like his eyes are heavy, but he just wants to keep them closed.  He reports blurry vision, stating that his eyes are tearing a lot and they feel tired.  He denies fevers, chills, ear pain, vision loss, slurred speech, sore throat, chest pain, shortness of breath, cough, nausea, vomiting, abdominal pain, urinary symptoms, abnormal bowel movements.  He denies numbness or tingling of his hands or legs.  He is ambulatory without difficulty.  He has no medical problems, takes no medications daily.  HPI  Past Medical History:  Diagnosis Date  . Headaches, cluster     Patient Active Problem List   Diagnosis Date Noted  . Dizziness 06/26/2012  . Back pain 06/26/2012  . FLANK PAIN, RIGHT 12/14/2009  . RHINITIS, ALLERGIC 11/27/2006    History reviewed. No pertinent surgical history.      Home Medications    Prior  to Admission medications   Medication Sig Start Date End Date Taking? Authorizing Provider  albuterol (PROVENTIL HFA;VENTOLIN HFA) 108 (90 Base) MCG/ACT inhaler Inhale 2 puffs into the lungs every 4 (four) hours as needed for wheezing or shortness of breath. 02/16/16   Tharon Aquas, PA  azithromycin (ZITHROMAX Z-PAK) 250 MG tablet 2 tablets day 1 then 1 tablet each day until gone 02/16/16   Tharon Aquas, PA  benzonatate (TESSALON) 100 MG capsule Take 1 capsule (100 mg total) by mouth every 8 (eight) hours. 02/16/16   Tharon Aquas, PA  cetirizine (ZYRTEC) 10 MG tablet Take 1 tablet (10 mg total) by mouth daily. 08/01/18   Horald Birky, PA-C  guaiFENesin (ROBITUSSIN) 100 MG/5ML SOLN Take 5 mLs by mouth every 4 (four) hours as needed for cough or to loosen phlegm.    [provider]  predniSONE (STERAPRED UNI-PAK 21 TAB) 10 MG (21) TBPK tablet Use as directed 07/11/18   Arthor Captain, PA-C  SUMAtriptan (IMITREX) 50 MG tablet Take 1 tablet (50 mg total) by mouth once for 1 dose. May repeat in 2 hours if headache persists or recurs.  Do not exceed 200 mg (4 tablets) in 24 hours 07/11/18 07/11/18  Arthor Captain, PA-C    Family History Family History  Problem Relation Age of Onset  . Heart disease Cousin 46       Died suddenly during exercise of  heart problem- patient does not know what kind.    Social History Social History   Tobacco Use  . Smoking status: Never Smoker  . Smokeless tobacco: Never Used  Substance Use Topics  . Alcohol use: Yes    Comment: occ  . Drug use: No     Allergies   Patient has no known allergies.   Review of Systems Review of Systems  HENT: Positive for congestion (assocaited with HA) and nosebleeds (intermittent, not currently).   Eyes:       Eye tiredness/heaviness/tearing  Neurological: Positive for headaches (Intermittent).  All other systems reviewed and are negative.    Physical Exam Updated Vital Signs BP 115/74 (BP  Location: Right Arm)   Pulse 65   Temp 98.1 F (36.7 C) (Oral)   Resp 14   Ht 5\' 7"  (1.702 m)   Wt 59 kg   SpO2 100%   BMI 20.36 kg/m   Physical Exam  Constitutional: He is oriented to person, place, and time. He appears well-developed and well-nourished. No distress.  Sitting comfortably in the chair no acute distress.  Yawning frequently throughout the exam.  HENT:  Head: Normocephalic and atraumatic.  Nasal mucosal edema.  No epistaxis.  OP clear without tonsillar swelling or exudate.  Uvula midline with equal palate rise.  TMs nonerythematous nonbulging bilaterally.  Eyes: Pupils are equal, round, and reactive to light. Conjunctivae and EOM are normal.  No conjunctival injection.  EOMI and PERRLA.  No nystagmus.  Neck: Normal range of motion. Neck supple.  Cardiovascular: Normal rate, regular rhythm and intact distal pulses.  Pulmonary/Chest: Effort normal and breath sounds normal. No respiratory distress. He has no wheezes.  Abdominal: Soft. He exhibits no distension. There is no tenderness.  Musculoskeletal: Normal range of motion.  Neurological: He is alert and oriented to person, place, and time. He has normal strength. No cranial nerve deficit or sensory deficit. Coordination normal. GCS eye subscore is 4. GCS verbal subscore is 5. GCS motor subscore is 6.  No obvious neurologic deficits.  CN intact.  Nose to finger intact.  Fine movement and coordination intact.  Skin: Skin is warm and dry. Capillary refill takes less than 2 seconds.  Psychiatric: He has a normal mood and affect.  Nursing note and vitals reviewed.    ED Treatments / Results  Labs (all labs ordered are listed, but only abnormal results are displayed) Labs Reviewed - No data to display  EKG None  Radiology No results found.  Procedures Procedures (including critical care time)  Medications Ordered in ED Medications - No data to display   Initial Impression / Assessment and Plan / ED Course    I have reviewed the triage vital signs and the nursing notes.  Pertinent labs & imaging results that were available during my care of the patient were reviewed by me and considered in my medical decision making (see chart for details).     Patient presenting for evaluation of headaches, intermittent nosebleeds, and eye heaviness/tearing/blurriness.  Physical exam reassuring, he is sitting comfortably in the bed in no acute distress.  Appears nontoxic.  He is yawning frequently throughout the exam, and he reports decreased sleep this past 2 weeks.  Symptoms have worsened since he has been sleeping less.  Headaches are associated with eye tearing and nasal congestion, likely cluster headaches.  No acute neurologic deficits noted today.  I do not believe CT scan or further imaging is necessary at this time.  Patient with  intermittent epistaxis, none currently.  This may be related to the congestion caused by a cluster headaches.  Will trial antihistamine for congestion, although I do believe this is related to cluster HAs.  Discussed use of Tylenol and ibuprofen for headaches.  Discussed importance of follow-up with [facility] for further management of his headaches.  Discussed importance of sleeping sufficient number of hours.  At this time, patient appears safe for discharge.  Return precautions given.  Patient states he understands and agrees to plan.  Final Clinical Impressions(s) / ED Diagnoses   Final diagnoses:  Epistaxis  Cluster headache, not intractable, unspecified chronicity pattern  Sleep deprivation    ED Discharge Orders         Ordered    cetirizine (ZYRTEC) 10 MG tablet  Daily     08/01/18 2245           Alveria Apley, PA-C 08/02/18 0106    Raeford Razor, MD 08/02/18 2352

## 2018-08-24 ENCOUNTER — Other Ambulatory Visit: Payer: Self-pay

## 2018-08-24 ENCOUNTER — Emergency Department (HOSPITAL_COMMUNITY)
Admission: EM | Admit: 2018-08-24 | Discharge: 2018-08-24 | Disposition: A | Discharge status: Home / Self Care | Payer: Self-pay | Attending: Emergency Medicine | Admitting: Emergency Medicine

## 2018-08-24 DIAGNOSIS — R0602 Shortness of breath: Secondary | ICD-10-CM | POA: Insufficient documentation

## 2018-08-24 DIAGNOSIS — R112 Nausea with vomiting, unspecified: Secondary | ICD-10-CM | POA: Insufficient documentation

## 2018-08-24 DIAGNOSIS — R109 Unspecified abdominal pain: Secondary | ICD-10-CM

## 2018-08-24 DIAGNOSIS — R1084 Generalized abdominal pain: Secondary | ICD-10-CM | POA: Insufficient documentation

## 2018-08-24 LAB — COMPREHENSIVE METABOLIC PANEL
ALT: 13 U/L (ref 0–44)
AST: 20 U/L (ref 15–41)
Albumin: 3.7 g/dL (ref 3.5–5.0)
Alkaline Phosphatase: 52 U/L (ref 38–126)
Anion gap: 6 (ref 5–15)
BILIRUBIN TOTAL: 0.6 mg/dL (ref 0.3–1.2)
BUN: 11 mg/dL (ref 6–20)
CO2: 27 mmol/L (ref 22–32)
Calcium: 8.7 mg/dL — ABNORMAL LOW (ref 8.9–10.3)
Chloride: 105 mmol/L (ref 98–111)
Creatinine, Ser: 1.05 mg/dL (ref 0.61–1.24)
Glucose, Bld: 99 mg/dL (ref 70–99)
Potassium: 3.9 mmol/L (ref 3.5–5.1)
Sodium: 138 mmol/L (ref 135–145)
TOTAL PROTEIN: 6.6 g/dL (ref 6.5–8.1)

## 2018-08-24 LAB — URINALYSIS, ROUTINE W REFLEX MICROSCOPIC
BILIRUBIN URINE: NEGATIVE
Glucose, UA: NEGATIVE mg/dL
Hgb urine dipstick: NEGATIVE
Ketones, ur: NEGATIVE mg/dL
LEUKOCYTES UA: NEGATIVE
NITRITE: NEGATIVE
Protein, ur: NEGATIVE mg/dL
SPECIFIC GRAVITY, URINE: 1.027 (ref 1.005–1.030)
pH: 6 (ref 5.0–8.0)

## 2018-08-24 LAB — CBC
HCT: 40.6 % (ref 39.0–52.0)
HEMOGLOBIN: 13 g/dL (ref 13.0–17.0)
MCH: 29 pg (ref 26.0–34.0)
MCHC: 32 g/dL (ref 30.0–36.0)
MCV: 90.4 fL (ref 80.0–100.0)
NRBC: 0 % (ref 0.0–0.2)
Platelets: 301 10*3/uL (ref 150–400)
RBC: 4.49 MIL/uL (ref 4.22–5.81)
RDW: 13.4 % (ref 11.5–15.5)
WBC: 5 10*3/uL (ref 4.0–10.5)

## 2018-08-24 LAB — LIPASE, BLOOD: Lipase: 37 U/L (ref 11–51)

## 2018-08-24 MED ORDER — ONDANSETRON 4 MG PO TBDP: 4.0000 mg | ORAL_TABLET | Freq: Three times a day (TID) | ORAL | 0 refills | Status: AC | PRN

## 2018-08-24 MED ORDER — ONDANSETRON 4 MG PO TBDP
4.0000 mg | ORAL_TABLET | Freq: Once | ORAL | Status: AC
  Administered 2018-08-24: 4 mg via ORAL
  Filled 2018-08-24: qty 1

## 2018-08-24 NOTE — ED Provider Notes (Signed)
[facility] EMERGENCY DEPARTMENT Provider Note   CSN: [id number] Arrival date & time: 08/24/18  1948     History   Chief Complaint Chief Complaint  Patient presents with  . Emesis    HPI Logan Simmons is a 27 y.o. male presents for evaluation of acute onset, persistent nausea and vomiting for 2 days.  He states that symptoms began after eating [place] food for dinner on Friday.  He reports he has had approximately 2 episodes of nonbloody nonbilious emesis every day for the past 2 days.  Reports constipation with last bowel movement 3 days ago though this is not abnormal for him.  He denies urinary symptoms, fevers, chills, cough, or chest pain.  He reports a generalized crampy abdominal pain after vomiting and some transient shortness of breath which will last for a few minutes and occurs after vomiting only.  Has tried drinking ginger ale without relief of his symptoms.  No recent treatment with antibiotics, no recent travel out of the country.  The history is provided by the patient.    Past Medical History:  Diagnosis Date  . Headaches, cluster     Patient Active Problem List   Diagnosis Date Noted  . Dizziness 06/26/2012  . Back pain 06/26/2012  . FLANK PAIN, RIGHT 12/14/2009  . RHINITIS, ALLERGIC 11/27/2006    No past surgical history on file.      Home Medications    Prior to Admission medications   Medication Sig Start Date End Date Taking? Authorizing Provider  albuterol (PROVENTIL HFA;VENTOLIN HFA) 108 (90 Base) MCG/ACT inhaler Inhale 2 puffs into the lungs every 4 (four) hours as needed for wheezing or shortness of breath. Patient not taking: Reported on 08/24/2018 02/16/16   Tharon Aquas, PA  azithromycin (ZITHROMAX Z-PAK) 250 MG tablet 2 tablets day 1 then 1 tablet each day until gone Patient not taking: Reported on 08/24/2018 02/16/16   Tharon Aquas, PA  benzonatate (TESSALON) 100 MG capsule Take 1 capsule (100 mg total) by mouth  every 8 (eight) hours. Patient not taking: Reported on 08/24/2018 02/16/16   Tharon Aquas, PA  cetirizine (ZYRTEC) 10 MG tablet Take 1 tablet (10 mg total) by mouth daily. Patient not taking: Reported on 08/24/2018 08/01/18   Caccavale, Sophia, PA-C  ondansetron (ZOFRAN ODT) 4 MG disintegrating tablet Take 1 tablet (4 mg total) by mouth every 8 (eight) hours as needed for up to 3 days for nausea or vomiting. 08/24/18 08/27/18  Michela Pitcher A, PA-C  predniSONE (STERAPRED UNI-PAK 21 TAB) 10 MG (21) TBPK tablet Use as directed Patient not taking: Reported on 08/24/2018 07/11/18   Arthor Captain, PA-C    Family History Family History  Problem Relation Age of Onset  . Heart disease Cousin 83       Died suddenly during exercise of heart problem- patient does not know what kind.    Social History Social History   Tobacco Use  . Smoking status: Never Smoker  . Smokeless tobacco: Never Used  Substance Use Topics  . Alcohol use: Yes    Comment: occ  . Drug use: No     Allergies   Patient has no known allergies.   Review of Systems Review of Systems  Constitutional: Negative for chills and fever.  Cardiovascular: Negative for chest pain.  Gastrointestinal: Positive for abdominal pain, constipation, nausea and vomiting.  Genitourinary: Negative for dysuria, frequency, hematuria and urgency.  All other systems reviewed and are negative.  Physical Exam Updated Vital Signs BP 110/69   Pulse 66   Temp 98.1 F (36.7 C) (Oral)   Resp 16   Ht 5\' 7"  (1.702 m)   Wt 59 kg   SpO2 95%   BMI 20.36 kg/m   Physical Exam  Constitutional: He appears well-developed and well-nourished. No distress.  Resting comfortably in bed.   HENT:  Head: Normocephalic and atraumatic.  Eyes: Conjunctivae are normal. Right eye exhibits no discharge. Left eye exhibits no discharge.  Neck: Normal range of motion. Neck supple. No JVD present. No tracheal deviation present.  Cardiovascular: Normal  rate, regular rhythm, normal heart sounds and intact distal pulses.  Pulmonary/Chest: Effort normal and breath sounds normal. He exhibits no tenderness.  Speaking in full sentences without difficulty  Abdominal: Soft. Bowel sounds are normal. He exhibits no distension. There is no tenderness. There is no rigidity, no rebound, no guarding, no CVA tenderness, no tenderness at McBurney's point and negative Murphy's sign.  Musculoskeletal: He exhibits no edema.  Neurological: He is alert.  Skin: Skin is warm and dry. No erythema.  Psychiatric: He has a normal mood and affect. His behavior is normal.  Nursing note and vitals reviewed.    ED Treatments / Results  Labs (all labs ordered are listed, but only abnormal results are displayed) Labs Reviewed  COMPREHENSIVE METABOLIC PANEL - Abnormal; Notable for the following components:      Result Value   Calcium 8.7 (*)    All other components within normal limits  LIPASE, BLOOD  CBC  URINALYSIS, ROUTINE W REFLEX MICROSCOPIC    EKG None  Radiology No results found.  Procedures Procedures (including critical care time)  Medications Ordered in ED Medications  ondansetron (ZOFRAN-ODT) disintegrating tablet 4 mg (4 mg Oral Given 08/24/18 2120)     Initial Impression / Assessment and Plan / ED Course  I have reviewed the triage vital signs and the nursing notes.  Pertinent labs & imaging results that were available during my care of the patient were reviewed by me and considered in my medical decision making (see chart for details).     Patient presenting with 2-day history of nausea and vomiting and intermittent abdominal pain and shortness of breath.  Abdominal pain and shortness of breath occur only after vomiting.  Patient is afebrile, vital signs are stable.  He is nontoxic in appearance.  Examination of the abdomen is benign, no peritoneal signs.  Lungs are clear to auscultation and SPO2 saturations are stable.  Patient in no  apparent distress, doubt acute cardiopulmonary abnormality.  Lab work reviewed by me shows no leukocytosis, no metabolic derangements, no renal insufficiency.  Lipase and LFTs are within normal limits.  UA does not seduce UTI or nephrolithiasis.  Given benign examination I doubt acute surgical abdominal pathology such as obstruction, perforation, appendicitis, cholecystitis, or dissection.  I see no need for advanced imaging at this time.  He was given Zofran ODT and then fluid challenged.  On reevaluation he is tolerating p.o. fluids without difficulty.  Suspect gastroenteritis, likely viral in etiology.  Will discharge with Zofran, given instructions to advance diet slowly.  Recommend follow with PCP if symptoms persist. Discussed strict ED return precautions. Pt verbalized understanding of and agreement with plan and is safe for discharge home at this time.   Final Clinical Impressions(s) / ED Diagnoses   Final diagnoses:  Non-intractable vomiting with nausea, unspecified vomiting type  Intermittent abdominal pain    ED Discharge Orders  Ordered    ondansetron (ZOFRAN ODT) 4 MG disintegrating tablet  Every 8 hours PRN     08/24/18 2147           Bennye AlmFawze, Lambros Cerro A, PA-C 08/24/18 2147    Mancel BaleWentz, Elliott, MD 08/25/18 1526

## 2018-08-24 NOTE — ED Triage Notes (Signed)
Patient c/o vomiting x2 days. Also c/o SOB.

## 2018-08-24 NOTE — ED Notes (Signed)
Pt given UA cup and instructed to provide urine sample when possible. Pt verbalized understanding.   

## 2018-08-24 NOTE — Discharge Instructions (Signed)
1. Medications: Take Zofran as needed for nausea.  Wait around 20 minutes before eating or drinking after taking this medication. 2. Treatment: rest, drink plenty of fluids, advance diet slowly.  Start with water and broth then advance to bland foods that will not upset your stomach such as crackers, mashed potatoes, and peanut butter. 3. Follow Up: Please followup with your primary doctor in 3 days for discussion of your diagnoses and further evaluation after today's visit; if you do not have a primary care doctor use the resource guide provided to find one; Please return to the ER for persistent vomiting, high fevers or worsening symptoms

## 2018-08-28 ENCOUNTER — Encounter (HOSPITAL_COMMUNITY): Payer: Self-pay

## 2018-08-28 ENCOUNTER — Other Ambulatory Visit: Payer: Self-pay

## 2018-08-28 ENCOUNTER — Emergency Department (HOSPITAL_COMMUNITY)
Admission: EM | Admit: 2018-08-28 | Discharge: 2018-08-28 | Disposition: A | Discharge status: Home / Self Care | Payer: Self-pay | Attending: Emergency Medicine | Admitting: Emergency Medicine

## 2018-08-28 DIAGNOSIS — R1110 Vomiting, unspecified: Secondary | ICD-10-CM

## 2018-08-28 DIAGNOSIS — R112 Nausea with vomiting, unspecified: Secondary | ICD-10-CM | POA: Insufficient documentation

## 2018-08-28 DIAGNOSIS — R197 Diarrhea, unspecified: Secondary | ICD-10-CM | POA: Insufficient documentation

## 2018-08-28 MED ORDER — ONDANSETRON HCL 4 MG PO TABS
8.0000 mg | ORAL_TABLET | Freq: Once | ORAL | Status: AC
  Administered 2018-08-28: 8 mg via ORAL
  Filled 2018-08-28: qty 2

## 2018-08-28 MED ORDER — ONDANSETRON HCL 4 MG PO TABS: 4.0000 mg | ORAL_TABLET | Freq: Three times a day (TID) | ORAL | 0 refills | Status: AC | PRN

## 2018-08-28 NOTE — ED Provider Notes (Signed)
[facility] EMERGENCY DEPARTMENT Provider Note   CSN: [id number] Arrival date & time: 08/28/18  1734     History   Chief Complaint Chief Complaint  Patient presents with  . Abdominal Pain    HPI Logan Simmons is a 27 y.o. male.  27 year old male with no significant past medical history, presented with 1 week Hx of nausea and vomiting 2-3 times a day regardless of eating.  He was seen in the emergency room 4 days ago for similar problem and diagnosed with possible viral gastroenteritis as his CBC, CMP, UA, lipase were reassuring. He gor Zofran that improved his symptoms and discharged. However he lost his home prescription for Zofran and came back to ED today. He endorses some nonbloody diarrhea since yesterday. Does not remember what color it was. Reports some dizziness and pain on his epigastric area only after vomiting.  But no significant abdominal pain other than that. Denies any fever or chills. No dysuria or frequency. No sick contact.  He mentions that he is mainly here to get the prescription again. He says he works alot these days and was not able to rest when having these symptoms.      Past Medical History:  Diagnosis Date  . Headaches, cluster     Patient Active Problem List   Diagnosis Date Noted  . Dizziness 06/26/2012  . Back pain 06/26/2012  . FLANK PAIN, RIGHT 12/14/2009  . RHINITIS, ALLERGIC 11/27/2006    History reviewed. No pertinent surgical history.      Home Medications    Prior to Admission medications   Medication Sig Start Date End Date Taking? Authorizing Provider  albuterol (PROVENTIL HFA;VENTOLIN HFA) 108 (90 Base) MCG/ACT inhaler Inhale 2 puffs into the lungs every 4 (four) hours as needed for wheezing or shortness of breath. Patient not taking: Reported on 08/24/2018 02/16/16   Tharon Aquas, PA  azithromycin (ZITHROMAX Z-PAK) 250 MG tablet 2 tablets day 1 then 1 tablet each day until gone Patient not taking:  Reported on 08/24/2018 02/16/16   Tharon Aquas, PA  benzonatate (TESSALON) 100 MG capsule Take 1 capsule (100 mg total) by mouth every 8 (eight) hours. Patient not taking: Reported on 08/24/2018 02/16/16   Tharon Aquas, PA  cetirizine (ZYRTEC) 10 MG tablet Take 1 tablet (10 mg total) by mouth daily. Patient not taking: Reported on 08/24/2018 08/01/18   Caccavale, Sophia, PA-C  predniSONE (STERAPRED UNI-PAK 21 TAB) 10 MG (21) TBPK tablet Use as directed Patient not taking: Reported on 08/24/2018 07/11/18   Arthor Captain, PA-C    Family History Family History  Problem Relation Age of Onset  . Heart disease Cousin 68       Died suddenly during exercise of heart problem- patient does not know what kind.    Social History Social History   Tobacco Use  . Smoking status: Never Smoker  . Smokeless tobacco: Never Used  Substance Use Topics  . Alcohol use: Yes    Comment: occ  . Drug use: No     Allergies   Patient has no known allergies.   Review of Systems Review of Systems   Physical Exam Updated Vital Signs BP 131/80   Pulse 71   Temp 98.5 F (36.9 C) (Oral)   Resp 16   Ht 5\' 7"  (1.702 m)   Wt 59 kg   SpO2 99%   BMI 20.36 kg/m   Physical Exam  Constitutional: He is oriented to person, place,  and time. He appears well-developed and well-nourished. He does not appear ill.  HENT:  Head: Normocephalic and atraumatic.  Eyes: EOM are normal.  Pulmonary/Chest: Effort normal and breath sounds normal. He has no wheezes. He has no rales.  Abdominal: Soft. Normal appearance and bowel sounds are normal. He exhibits no distension. There is no tenderness.  Neurological: He is alert and oriented to person, place, and time.  Skin: Skin is warm and dry. Capillary refill takes less than 2 seconds.  Psychiatric: He has a normal mood and affect.     ED Treatments / Results  Labs (all labs ordered are listed, but only abnormal results are displayed) Labs Reviewed - No  data to display  EKG None  Radiology No results found.  Procedures Procedures (including critical care time)  Medications Ordered in ED Medications - No data to display   Initial Impression / Assessment and Plan / ED Course  I have reviewed the triage vital signs and the nursing notes.  Pertinent labs & imaging results that were available during my care of the patient were reviewed by me and considered in my medical decision making (see chart for details).    Patient presented with 1 week history of nausea, vomiting and few days of non bloody diarrhea after eating.  No abdominal pain unless after vomiting. Also some dizziness after vomiting. He was seen on ED 4 days ago for similar complains when U/A and CBC and CMP, Lipase were reassuring and he discharged with PO Zofran for possible viral gasteroentritis. How ever he lost his prescription and came back again. Also asks for work note.  He is hemodynamically stable on arrival. Denies current dizziness, abdominal pain. Does not vomit now.   Abdominal exam is benign. I recommended IV hydration but he does not want any Iv medication or normal saline today. I explained he may need IV fluid as his dizziness might be due to dehydration,. He still refuses.  He is stable and asymptomatic currently. Will give him PO Zofran and if improves can be discharge.   -Zofran 8 mg PO -Discharge with PO Zofran, recommended to take enough fluid and gave all ED return precautions. Also f/u with PCP in 5 days if no improvement. He verbalized understandings and agrees with the plan.   Final Clinical Impressions(s) / ED Diagnoses   Final diagnoses:  None    ED Discharge Orders    None       Chevis PrettyMasoudi, Shakayla Hickox, MD 08/28/18 2006    Marily MemosMesner, Jason, MD 08/29/18 2022

## 2018-08-28 NOTE — ED Triage Notes (Signed)
Pt endorses N/V and abdominal pain x1 week. Pt reports 1 episode of vomiting in past 24 hours and 4 episodes of diarrhea. Pt seen here on 25th for same and reports he lost his discharge papers and prescriptions. Pt denies urinary symptoms.

## 2018-08-28 NOTE — Discharge Instructions (Addendum)
Thank you for allowing us taking care of you at [facility] emergency medicine. You were seen due to 1 week history of nausea, vomiting and diarrhea and because you lost your prescription that was given to you in emergency room 4 days ago. I understand you do not want to get any intravenous medication and IV fluid today. I gave you oral medication for nausea and prescribe it as well for home. We recommend to drink enough fluid at home and follow up with your primary care if your symptoms did not improve. You can always come back to emergency room if your symptoms got worse.   Thank you

## 2021-05-25 ENCOUNTER — Encounter (HOSPITAL_COMMUNITY): Payer: Self-pay

## 2021-05-25 ENCOUNTER — Ambulatory Visit (HOSPITAL_COMMUNITY)
Admission: EM | Admit: 2021-05-25 | Discharge: 2021-05-25 | Disposition: A | Discharge status: Home / Self Care | Payer: Self-pay | Attending: Medical Oncology | Admitting: Medical Oncology

## 2021-05-25 ENCOUNTER — Other Ambulatory Visit: Payer: Self-pay

## 2021-05-25 DIAGNOSIS — M79602 Pain in left arm: Secondary | ICD-10-CM

## 2021-05-25 MED ORDER — MELOXICAM 15 MG PO TABS: 15.0000 mg | ORAL_TABLET | Freq: Every day | ORAL | 0 refills | Status: DC

## 2021-05-25 NOTE — ED Provider Notes (Signed)
[facility]    CSN: [id number] Arrival date & time: 05/25/21  1418      History   Chief Complaint Chief Complaint  Patient presents with   Arm Pain    HPI Logan Simmons is a 30 y.o. male.   HPI  Arm Pain: Pt reports that months ago he was giving plasma and developed a hematoma about the size of a golf ball of his right antecubital fossa. He states that this initially caused pain and that this pain has continued. Pain radiates up and down arm but starts in the arm fold of elbow. He reports some sensation of loss of grip strength which has occurred since. He has not tried anything for symptoms.   Past Medical History:  Diagnosis Date   Headaches, cluster     Patient Active Problem List   Diagnosis Date Noted   Dizziness 06/26/2012   Back pain 06/26/2012   FLANK PAIN, RIGHT 12/14/2009   RHINITIS, ALLERGIC 11/27/2006    History reviewed. No pertinent surgical history.     Home Medications    Prior to Admission medications   Medication Sig Start Date End Date Taking? Authorizing Provider  meloxicam (MOBIC) 15 MG tablet Take 1 tablet (15 mg total) by mouth daily. 05/25/21  Yes Derriona Branscom M, PA-C  albuterol (PROVENTIL HFA;VENTOLIN HFA) 108 (90 Base) MCG/ACT inhaler Inhale 2 puffs into the lungs every 4 (four) hours as needed for wheezing or shortness of breath. Patient not taking: Reported on 08/24/2018 02/16/16   Tharon Aquas, PA  azithromycin (ZITHROMAX Z-PAK) 250 MG tablet 2 tablets day 1 then 1 tablet each day until gone Patient not taking: Reported on 08/24/2018 02/16/16   Tharon Aquas, PA  benzonatate (TESSALON) 100 MG capsule Take 1 capsule (100 mg total) by mouth every 8 (eight) hours. Patient not taking: Reported on 08/24/2018 02/16/16   Tharon Aquas, PA  cetirizine (ZYRTEC) 10 MG tablet Take 1 tablet (10 mg total) by mouth daily. Patient not taking: Reported on 08/24/2018 08/01/18   Caccavale, Sophia, PA-C  ondansetron (ZOFRAN) 4 MG  tablet Take 1 tablet (4 mg total) by mouth every 8 (eight) hours as needed for nausea or vomiting. 08/28/18   Masoudi, Elhamalsadat, MD  predniSONE (STERAPRED UNI-PAK 21 TAB) 10 MG (21) TBPK tablet Use as directed Patient not taking: Reported on 08/24/2018 07/11/18   Arthor Captain, PA-C    Family History Family History  Problem Relation Age of Onset   Heart disease Cousin 51       Died suddenly during exercise of heart problem- patient does not know what kind.    Social History Social History   Tobacco Use   Smoking status: Never   Smokeless tobacco: Never  Substance Use Topics   Alcohol use: Yes    Comment: occ   Drug use: No     Allergies   Patient has no known allergies.   Review of Systems Review of Systems  As stated above in HPI Physical Exam Triage Vital Signs ED Triage Vitals  Enc Vitals Group     BP 05/25/21 1528 111/73     Pulse Rate 05/25/21 1528 60     Resp 05/25/21 1528 18     Temp 05/25/21 1528 98.8 F (37.1 C)     Temp Source 05/25/21 1528 Oral     SpO2 05/25/21 1528 100 %     Weight --      Height --  Head Circumference --      Peak Flow --      Pain Score 05/25/21 1524 8     Pain Loc --      Pain Edu? --      Excl. in GC? --    No data found.  Updated Vital Signs BP 111/73 (BP Location: Left Arm)   Pulse 60   Temp 98.8 F (37.1 C) (Oral)   Resp 18   SpO2 100%   Physical Exam Vitals and nursing note reviewed.  Constitutional:      General: He is not in acute distress.    Appearance: Normal appearance. He is not ill-appearing, toxic-appearing or diaphoretic.  Cardiovascular:     Pulses: Normal pulses.     Heart sounds: Normal heart sounds.  Pulmonary:     Effort: Pulmonary effort is normal.     Breath sounds: Normal breath sounds.  Musculoskeletal:        General: No swelling or tenderness. Normal range of motion.  Skin:    General: Skin is warm.     Capillary Refill: Capillary refill takes 2 to 3 seconds.      Coloration: Skin is not jaundiced or pale.     Findings: No bruising or lesion.  Neurological:     Mental Status: He is alert and oriented to person, place, and time.     UC Treatments / Results  Labs (all labs ordered are listed, but only abnormal results are displayed) Labs Reviewed - No data to display  EKG   Radiology No results found.  Procedures Procedures (including critical care time)  Medications Ordered in UC Medications - No data to display  Initial Impression / Assessment and Plan / UC Course  I have reviewed the triage vital signs and the nursing notes.  Pertinent labs & imaging results that were available during my care of the patient were reviewed by me and considered in my medical decision making (see chart for details).     New. I would like for him to trial mobic x 7-14 days and if no improvement is made for him to schedule with ortho or neuro. Discussed red flag signs and symptoms.  Final Clinical Impressions(s) / UC Diagnoses   Final diagnoses:  Left arm pain   Discharge Instructions   None    ED Prescriptions     Medication Sig Dispense Auth. Provider   meloxicam (MOBIC) 15 MG tablet Take 1 tablet (15 mg total) by mouth daily. 14 tablet Rushie Chestnut, [place]      PDMP not reviewed this encounter.   Rushie Chestnut, [place] 05/25/21 1605

## 2021-05-25 NOTE — ED Triage Notes (Signed)
Pt states he was giving plastma at [facility], he was told he had a hematoma to right arm. Pt states he is having pain in his arm and he is not able to life heavier things.  Started: July 26th

## 2021-06-02 ENCOUNTER — Emergency Department (HOSPITAL_COMMUNITY): Payer: Self-pay

## 2021-06-02 ENCOUNTER — Encounter (HOSPITAL_COMMUNITY): Payer: Self-pay | Admitting: Emergency Medicine

## 2021-06-02 ENCOUNTER — Emergency Department (HOSPITAL_COMMUNITY)
Admission: EM | Admit: 2021-06-02 | Discharge: 2021-06-02 | Disposition: A | Discharge status: Home / Self Care | Payer: Self-pay | Attending: Emergency Medicine | Admitting: Emergency Medicine

## 2021-06-02 ENCOUNTER — Other Ambulatory Visit: Payer: Self-pay

## 2021-06-02 DIAGNOSIS — S0990XA Unspecified injury of head, initial encounter: Secondary | ICD-10-CM | POA: Insufficient documentation

## 2021-06-02 DIAGNOSIS — Y92410 Unspecified street and highway as the place of occurrence of the external cause: Secondary | ICD-10-CM | POA: Insufficient documentation

## 2021-06-02 DIAGNOSIS — S20212A Contusion of left front wall of thorax, initial encounter: Secondary | ICD-10-CM | POA: Insufficient documentation

## 2021-06-02 LAB — CBC WITH DIFFERENTIAL/PLATELET
Abs Immature Granulocytes: 0.01 10*3/uL (ref 0.00–0.07)
Basophils Absolute: 0.1 10*3/uL (ref 0.0–0.1)
Basophils Relative: 1 %
Eosinophils Absolute: 0.3 10*3/uL (ref 0.0–0.5)
Eosinophils Relative: 4 %
HCT: 41.8 % (ref 39.0–52.0)
Hemoglobin: 14.2 g/dL (ref 13.0–17.0)
Immature Granulocytes: 0 %
Lymphocytes Relative: 25 %
Lymphs Abs: 1.6 10*3/uL (ref 0.7–4.0)
MCH: 31.1 pg (ref 26.0–34.0)
MCHC: 34 g/dL (ref 30.0–36.0)
MCV: 91.5 fL (ref 80.0–100.0)
Monocytes Absolute: 0.5 10*3/uL (ref 0.1–1.0)
Monocytes Relative: 9 %
Neutro Abs: 3.9 10*3/uL (ref 1.7–7.7)
Neutrophils Relative %: 61 %
Platelets: 278 10*3/uL (ref 150–400)
RBC: 4.57 MIL/uL (ref 4.22–5.81)
RDW: 12.7 % (ref 11.5–15.5)
WBC: 6.3 10*3/uL (ref 4.0–10.5)
nRBC: 0 % (ref 0.0–0.2)

## 2021-06-02 LAB — I-STAT CHEM 8, ED
BUN: 9 mg/dL (ref 6–20)
Calcium, Ion: 1.25 mmol/L (ref 1.15–1.40)
Chloride: 105 mmol/L (ref 98–111)
Creatinine, Ser: 1 mg/dL (ref 0.61–1.24)
Glucose, Bld: 97 mg/dL (ref 70–99)
HCT: 43 % (ref 39.0–52.0)
Hemoglobin: 14.6 g/dL (ref 13.0–17.0)
Potassium: 4.2 mmol/L (ref 3.5–5.1)
Sodium: 139 mmol/L (ref 135–145)
TCO2: 25 mmol/L (ref 22–32)

## 2021-06-02 LAB — COMPREHENSIVE METABOLIC PANEL
ALT: 13 U/L (ref 0–44)
AST: 28 U/L (ref 15–41)
Albumin: 3.9 g/dL (ref 3.5–5.0)
Alkaline Phosphatase: 51 U/L (ref 38–126)
Anion gap: 7 (ref 5–15)
BUN: 10 mg/dL (ref 6–20)
CO2: 24 mmol/L (ref 22–32)
Calcium: 9.3 mg/dL (ref 8.9–10.3)
Chloride: 106 mmol/L (ref 98–111)
Creatinine, Ser: 1.02 mg/dL (ref 0.61–1.24)
GFR, Estimated: >60 mL/min (ref >60)
Glucose, Bld: 99 mg/dL (ref 70–99)
Potassium: 4.1 mmol/L (ref 3.5–5.1)
Sodium: 137 mmol/L (ref 135–145)
Total Bilirubin: 0.9 mg/dL (ref 0.3–1.2)
Total Protein: 6.9 g/dL (ref 6.5–8.1)

## 2021-06-02 MED ORDER — IBUPROFEN 800 MG PO TABS
800.0000 mg | ORAL_TABLET | Freq: Once | ORAL | Status: AC
  Administered 2021-06-02: 800 mg via ORAL
  Filled 2021-06-02: qty 1

## 2021-06-02 MED ORDER — IOHEXOL 350 MG/ML SOLN
75.0000 mL | Freq: Once | INTRAVENOUS | Status: AC | PRN
  Administered 2021-06-02: 75 mL via INTRAVENOUS
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
## 2021-06-02 NOTE — Discharge Instructions (Addendum)
Take Motrin and Tylenol as needed as directed for pain. Alternate warm and cold compresses to area as needed for pain.
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
## 2021-06-02 NOTE — ED Notes (Signed)
Pt stepped outside.  
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
## 2022-07-16 ENCOUNTER — Emergency Department (HOSPITAL_COMMUNITY): Payer: Self-pay

## 2022-07-16 ENCOUNTER — Emergency Department (HOSPITAL_COMMUNITY)
Admission: EM | Admit: 2022-07-16 | Discharge: 2022-07-16 | Disposition: A | Discharge status: Home / Self Care | Payer: Self-pay | Attending: Emergency Medicine | Admitting: Emergency Medicine

## 2022-07-16 ENCOUNTER — Other Ambulatory Visit: Payer: Self-pay

## 2022-07-16 DIAGNOSIS — R079 Chest pain, unspecified: Secondary | ICD-10-CM | POA: Insufficient documentation

## 2022-07-16 DIAGNOSIS — R519 Headache, unspecified: Secondary | ICD-10-CM | POA: Insufficient documentation

## 2022-07-16 DIAGNOSIS — G4700 Insomnia, unspecified: Secondary | ICD-10-CM | POA: Insufficient documentation

## 2022-07-16 MED ORDER — IBUPROFEN 800 MG PO TABS
800.0000 mg | ORAL_TABLET | Freq: Once | ORAL | Status: AC
  Administered 2022-07-16: 800 mg via ORAL
  Filled 2022-07-16: qty 1

## 2022-07-16 MED ORDER — LIDOCAINE 5 % EX PTCH
1.0000 | MEDICATED_PATCH | CUTANEOUS | Status: DC
  Administered 2022-07-16: 1 via TRANSDERMAL
  Filled 2022-07-16: qty 1
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
## 2022-12-09 NOTE — Progress Notes (Deleted)
    SUBJECTIVE:   CHIEF COMPLAINT / HPI:   Logan Simmons is a 32 year-old male here to establish care with PCP.  Social history: Prior PCP:  Last went to doctor Occupation:  Lives with:  Smoking/Vaping:   Alcohol:  Marijuana/ilicit substances:   Exercise:    Allergies:  Daily medications:  Medical diagnoses:  Surgical hx:  Family hx:     COVID:  Flu vaccines:  Concerns today:   PERTINENT  PMH / PSH: ***  OBJECTIVE:   There were no vitals taken for this visit.  General: Alert and cooperative and appears to be in no acute distress Cardio: Normal S1 and S2, no S3 or S4. Rhythm is regular. No murmurs or rubs.   Pulm: Clear to auscultation bilaterally, no crackles, wheezing, or diminished breath sounds. Normal respiratory effort Abdomen: Bowel sounds normal. Abdomen soft and non-tender.  Extremities: No peripheral edema. Warm/ well perfused.  Strong radial pulses. Neuro: Cranial nerves grossly intact    ASSESSMENT/PLAN:   No problem-specific Assessment & Plan notes found for this encounter.     Orvis Brill, [place]    {    This will disappear when note is signed, click to select method of visit    :1}

## 2022-12-10 ENCOUNTER — Ambulatory Visit: Payer: Self-pay | Admitting: Student

## 2022-12-10 DIAGNOSIS — Z7689 Persons encountering health services in other specified circumstances: Secondary | ICD-10-CM

## 2022-12-14 ENCOUNTER — Emergency Department (HOSPITAL_COMMUNITY): Payer: Self-pay

## 2022-12-14 ENCOUNTER — Other Ambulatory Visit: Payer: Self-pay

## 2022-12-14 ENCOUNTER — Emergency Department (HOSPITAL_COMMUNITY)
Admission: EM | Admit: 2022-12-14 | Discharge: 2022-12-14 | Disposition: A | Discharge status: Home / Self Care | Payer: Self-pay | Attending: Emergency Medicine | Admitting: Emergency Medicine

## 2022-12-14 ENCOUNTER — Encounter (HOSPITAL_COMMUNITY): Payer: Self-pay | Admitting: *Deleted

## 2022-12-14 DIAGNOSIS — R0789 Other chest pain: Secondary | ICD-10-CM | POA: Insufficient documentation

## 2022-12-14 MED ORDER — KETOROLAC TROMETHAMINE 60 MG/2ML IM SOLN
30.0000 mg | Freq: Once | INTRAMUSCULAR | Status: AC
  Administered 2022-12-14: 30 mg via INTRAMUSCULAR
  Filled 2022-12-14: qty 2

## 2022-12-14 MED ORDER — LIDOCAINE 5 % EX PTCH: 1.0000 | MEDICATED_PATCH | CUTANEOUS | 0 refills | Status: AC

## 2022-12-14 MED ORDER — IBUPROFEN 800 MG PO TABS: 800.0000 mg | ORAL_TABLET | Freq: Three times a day (TID) | ORAL | 0 refills | Status: AC

## 2022-12-14 MED ORDER — LIDOCAINE 5 % EX PTCH
1.0000 | MEDICATED_PATCH | CUTANEOUS | Status: DC
  Administered 2022-12-14: 1 via TRANSDERMAL
  Filled 2022-12-14: qty 1
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
## 2022-12-14 NOTE — Discharge Instructions (Signed)
Recommend 800 mg of ibuprofen every 8 hours for the next couple of days, follow-up to establish care with your new PCP to discuss your chronic symptoms.  Your chest x-ray did not show any evidence of rib fracture or ruptured lung or any other acute cardiac or pulmonary abnormality.

## 2022-12-14 NOTE — ED Triage Notes (Signed)
The pt has had chest pain for 3-4 years and last night when he stretched he felt a pop in his chest    pain now only with movement

## 2022-12-19 ENCOUNTER — Encounter: Payer: Self-pay | Admitting: Family Medicine

## 2022-12-19 ENCOUNTER — Other Ambulatory Visit: Payer: Self-pay

## 2022-12-19 ENCOUNTER — Ambulatory Visit: Payer: Commercial Managed Care - HMO | Admitting: Family Medicine

## 2022-12-19 VITALS — BP 123/82 | HR 62 | Wt 154.4 lb

## 2022-12-19 DIAGNOSIS — R0789 Other chest pain: Secondary | ICD-10-CM | POA: Diagnosis not present

## 2022-12-19 DIAGNOSIS — D71 Functional disorders of polymorphonuclear neutrophils: Secondary | ICD-10-CM | POA: Insufficient documentation

## 2022-12-19 NOTE — Patient Instructions (Addendum)
It was great to see you today! Here's what we talked about:  [facility] [place], [place] [address] [phone]  Dr Charlann Boxer- for consideration of OMT (osteopathic manipulation treatment) for your anterior ribs.  Please let me know if you have any other questions.  Dr. Marcha Dutton
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
## 2022-12-19 NOTE — Assessment & Plan Note (Signed)
As identified on CT chest in 2022 under reading as "remote granulomatous disease." No pulmonary symptoms today. Do not suspect current chest wall pain secondary to this. Will follow up clinically at subsequent visits.
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
## 2023-12-13 ENCOUNTER — Encounter (HOSPITAL_COMMUNITY): Payer: Self-pay | Admitting: *Deleted

## 2023-12-13 ENCOUNTER — Other Ambulatory Visit: Payer: Self-pay

## 2023-12-13 ENCOUNTER — Emergency Department (HOSPITAL_COMMUNITY)
Admission: EM | Admit: 2023-12-13 | Discharge: 2023-12-13 | Disposition: A | Discharge status: Home / Self Care | Payer: Self-pay | Attending: Emergency Medicine | Admitting: Emergency Medicine

## 2023-12-13 DIAGNOSIS — R519 Headache, unspecified: Secondary | ICD-10-CM | POA: Diagnosis present

## 2023-12-13 DIAGNOSIS — Y92410 Unspecified street and highway as the place of occurrence of the external cause: Secondary | ICD-10-CM | POA: Diagnosis not present

## 2023-12-13 NOTE — Discharge Instructions (Signed)
 It was a pleasure taking part in your care.  If you change your mind on receiving imaging, please return to the ED.  Please take ibuprofen or Tylenol every 6 hours as needed for pain.  See attached work note.  Follow-up with your PCP.
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
## 2024-05-18 ENCOUNTER — Emergency Department (HOSPITAL_COMMUNITY)
Admission: EM | Admit: 2024-05-18 | Discharge: 2024-05-18 | Disposition: A | Discharge status: Home / Self Care | Payer: Self-pay

## 2024-05-18 DIAGNOSIS — S46911A Strain of unspecified muscle, fascia and tendon at shoulder and upper arm level, right arm, initial encounter: Secondary | ICD-10-CM | POA: Insufficient documentation

## 2024-05-18 DIAGNOSIS — Y990 Civilian activity done for income or pay: Secondary | ICD-10-CM | POA: Insufficient documentation

## 2024-05-18 DIAGNOSIS — W208XXA Other cause of strike by thrown, projected or falling object, initial encounter: Secondary | ICD-10-CM | POA: Insufficient documentation

## 2024-05-18 MED ORDER — NAPROXEN 500 MG PO TABS: 500.0000 mg | ORAL_TABLET | Freq: Two times a day (BID) | ORAL | 0 refills | Status: AC

## 2024-05-18 MED ORDER — KETOROLAC TROMETHAMINE 15 MG/ML IJ SOLN
15.0000 mg | Freq: Once | INTRAMUSCULAR | Status: AC
  Administered 2024-05-18: 15 mg via INTRAMUSCULAR

## 2024-05-18 NOTE — Discharge Instructions (Signed)
 You can take the naproxen  as prescribed for the next week.  Use Tylenol as needed for pain.
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
